# Patient Record
Sex: Female | Born: 1965 | Race: White | Hispanic: No | Marital: Married | Smoking: Former smoker
Health system: Southern US, Community
[De-identification: ages and names within clinical notes are randomized; demographics above are authoritative.]

## PROBLEM LIST (undated history)

## (undated) DIAGNOSIS — F99 Mental disorder, not otherwise specified: Secondary | ICD-10-CM

## (undated) DIAGNOSIS — R232 Flushing: Principal | ICD-10-CM

## (undated) DIAGNOSIS — Z87828 Personal history of other (healed) physical injury and trauma: Secondary | ICD-10-CM

## (undated) DIAGNOSIS — I10 Essential (primary) hypertension: Secondary | ICD-10-CM

## (undated) DIAGNOSIS — F419 Anxiety disorder, unspecified: Secondary | ICD-10-CM

## (undated) DIAGNOSIS — N950 Postmenopausal bleeding: Principal | ICD-10-CM

## (undated) DIAGNOSIS — G5600 Carpal tunnel syndrome, unspecified upper limb: Secondary | ICD-10-CM

## (undated) HISTORY — PX: CHOLECYSTECTOMY: SHX55

## (undated) HISTORY — DX: Personal history of other (healed) physical injury and trauma: Z87.828

## (undated) HISTORY — DX: Essential (primary) hypertension: I10

## (undated) HISTORY — DX: Carpal tunnel syndrome, unspecified upper limb: G56.00

## (undated) HISTORY — DX: Flushing: R23.2

## (undated) HISTORY — DX: Mental disorder, not otherwise specified: F99

## (undated) HISTORY — DX: Anxiety disorder, unspecified: F41.9

## (undated) HISTORY — DX: Postmenopausal bleeding: N95.0

---

## 2007-04-25 ENCOUNTER — Ambulatory Visit (HOSPITAL_COMMUNITY): Admission: RE | Admit: 2007-04-25 | Discharge: 2007-04-25 | Payer: Self-pay | Admitting: Family Medicine

## 2007-04-27 ENCOUNTER — Ambulatory Visit (HOSPITAL_COMMUNITY): Admission: RE | Admit: 2007-04-27 | Discharge: 2007-04-27 | Payer: Self-pay | Admitting: Family Medicine

## 2007-04-27 ENCOUNTER — Ambulatory Visit (HOSPITAL_COMMUNITY): Admission: RE | Admit: 2007-04-27 | Discharge: 2007-04-27 | Payer: Self-pay | Admitting: Obstetrics & Gynecology

## 2008-01-28 ENCOUNTER — Ambulatory Visit (HOSPITAL_COMMUNITY): Admission: RE | Admit: 2008-01-28 | Discharge: 2008-01-28 | Payer: Self-pay | Admitting: Obstetrics & Gynecology

## 2008-09-01 ENCOUNTER — Ambulatory Visit (HOSPITAL_COMMUNITY): Admission: RE | Admit: 2008-09-01 | Discharge: 2008-09-01 | Payer: Self-pay | Admitting: Family Medicine

## 2008-09-05 ENCOUNTER — Ambulatory Visit (HOSPITAL_COMMUNITY): Admission: RE | Admit: 2008-09-05 | Discharge: 2008-09-05 | Payer: Self-pay | Admitting: Family Medicine

## 2008-09-08 ENCOUNTER — Ambulatory Visit (HOSPITAL_COMMUNITY): Admission: RE | Admit: 2008-09-08 | Discharge: 2008-09-08 | Payer: Self-pay | Admitting: Family Medicine

## 2008-10-10 ENCOUNTER — Ambulatory Visit (HOSPITAL_COMMUNITY): Admission: RE | Admit: 2008-10-10 | Discharge: 2008-10-10 | Payer: Self-pay | Admitting: Neurology

## 2008-12-04 ENCOUNTER — Other Ambulatory Visit: Admission: RE | Admit: 2008-12-04 | Discharge: 2008-12-04 | Payer: Self-pay | Admitting: Obstetrics and Gynecology

## 2009-03-05 ENCOUNTER — Emergency Department (HOSPITAL_COMMUNITY): Admission: EM | Admit: 2009-03-05 | Discharge: 2009-03-06 | Payer: Self-pay | Admitting: Emergency Medicine

## 2010-01-29 ENCOUNTER — Ambulatory Visit (HOSPITAL_COMMUNITY): Admission: RE | Admit: 2010-01-29 | Discharge: 2010-01-29 | Payer: Self-pay | Admitting: Obstetrics & Gynecology

## 2010-03-25 ENCOUNTER — Other Ambulatory Visit: Admission: RE | Admit: 2010-03-25 | Discharge: 2010-03-25 | Payer: Self-pay | Admitting: Obstetrics and Gynecology

## 2010-10-10 ENCOUNTER — Encounter: Payer: Self-pay | Admitting: Family Medicine

## 2011-01-26 ENCOUNTER — Other Ambulatory Visit: Payer: Self-pay | Admitting: Family Medicine

## 2011-01-26 DIAGNOSIS — N644 Mastodynia: Secondary | ICD-10-CM

## 2011-02-01 NOTE — Procedures (Signed)
NAME:  Robin Dixon, Robin Dixon NO.:  [id number]   MEDICAL RECORD NO.:  [id number]          PATIENT TYPE:  OUT   LOCATION:  RAD                           FACILITY:  [facility]   PHYSICIAN:  Donna Bernard, M.D.DATE OF BIRTH:  17-May-1966   DATE OF PROCEDURE:  09/01/2008  DATE OF DISCHARGE:  09/01/2008                                  STRESS TEST   INDICATION FOR TEST:  This patient is experiencing atypical chest  discomfort and stress test was done for risk stratification purposes.   STRESS TEST:  Stress test was performed in standard [facility] protocol.  Resting EKG revealed normal sinus rhythm with no significant ST-T  changes.  The patient's baseline blood pressure was within normal  limits.  She tolerated the first three stages remarkably well.  She did  not reach her sub max predicted heart rate until the fourth stage.  At  this point, had 0.08 seconds passage at J-point.  There were no  significant ST-segment changes noted.  There is minimal less than 1 mm  depression with nicely ascending slope.   IMPRESSION:  Negative adequate stress test.   PLAN:  Exercise, encouraged the patient follow up with Dr. Lilyan Punt  for further concerns.      Donna Bernard, M.D.  Electronically Signed     WSL/MEDQ  D:  09/14/2008  T:  09/15/2008  Job:  [id number]

## 2011-02-02 ENCOUNTER — Ambulatory Visit (HOSPITAL_COMMUNITY)
Admission: RE | Admit: 2011-02-02 | Discharge: 2011-02-02 | Disposition: A | Discharge status: Home / Self Care | Payer: BC Managed Care – PPO | Source: Ambulatory Visit | Attending: Family Medicine | Admitting: Family Medicine

## 2011-02-02 ENCOUNTER — Other Ambulatory Visit: Payer: Self-pay | Admitting: Family Medicine

## 2011-02-02 DIAGNOSIS — Z1231 Encounter for screening mammogram for malignant neoplasm of breast: Secondary | ICD-10-CM | POA: Insufficient documentation

## 2011-02-02 DIAGNOSIS — N644 Mastodynia: Secondary | ICD-10-CM

## 2011-04-25 ENCOUNTER — Other Ambulatory Visit (HOSPITAL_COMMUNITY)
Admission: RE | Admit: 2011-04-25 | Discharge: 2011-04-25 | Disposition: A | Discharge status: Home / Self Care | Payer: BC Managed Care – PPO | Source: Ambulatory Visit | Attending: Obstetrics and Gynecology | Admitting: Obstetrics and Gynecology

## 2011-04-25 ENCOUNTER — Other Ambulatory Visit: Payer: Self-pay | Admitting: Adult Health

## 2011-04-25 DIAGNOSIS — Z01419 Encounter for gynecological examination (general) (routine) without abnormal findings: Secondary | ICD-10-CM | POA: Insufficient documentation

## 2012-02-21 ENCOUNTER — Other Ambulatory Visit: Payer: Self-pay | Admitting: Family Medicine

## 2012-02-21 DIAGNOSIS — Z139 Encounter for screening, unspecified: Secondary | ICD-10-CM

## 2012-02-24 ENCOUNTER — Ambulatory Visit (HOSPITAL_COMMUNITY)
Admission: RE | Admit: 2012-02-24 | Discharge: 2012-02-24 | Disposition: A | Discharge status: Home / Self Care | Payer: BC Managed Care – PPO | Source: Ambulatory Visit | Attending: Family Medicine | Admitting: Family Medicine

## 2012-02-24 DIAGNOSIS — Z139 Encounter for screening, unspecified: Secondary | ICD-10-CM

## 2012-02-24 DIAGNOSIS — Z1231 Encounter for screening mammogram for malignant neoplasm of breast: Secondary | ICD-10-CM | POA: Insufficient documentation

## 2012-04-26 ENCOUNTER — Other Ambulatory Visit (HOSPITAL_COMMUNITY)
Admission: RE | Admit: 2012-04-26 | Discharge: 2012-04-26 | Disposition: A | Discharge status: Home / Self Care | Payer: BC Managed Care – PPO | Source: Ambulatory Visit | Attending: Obstetrics and Gynecology | Admitting: Obstetrics and Gynecology

## 2012-04-26 ENCOUNTER — Other Ambulatory Visit: Payer: Self-pay | Admitting: Adult Health

## 2012-04-26 DIAGNOSIS — Z01419 Encounter for gynecological examination (general) (routine) without abnormal findings: Secondary | ICD-10-CM | POA: Insufficient documentation

## 2012-04-26 DIAGNOSIS — Z1151 Encounter for screening for human papillomavirus (HPV): Secondary | ICD-10-CM | POA: Insufficient documentation

## 2012-12-12 ENCOUNTER — Other Ambulatory Visit: Payer: Self-pay | Admitting: Family Medicine

## 2012-12-12 ENCOUNTER — Telehealth: Payer: Self-pay | Admitting: Family Medicine

## 2012-12-12 MED ORDER — ALPRAZOLAM 0.5 MG PO TABS: 0.5000 mg | ORAL_TABLET | Freq: Two times a day (BID) | ORAL | Status: DC

## 2012-12-12 NOTE — Telephone Encounter (Signed)
Nurse: 9:25AM Steve/Carolyn Patient: Robin Dixon dob 02-Apr-1966  Msg: refill has ran out for Zanax * last refill has been a while. Requesting for a new rx. CB: [phone] Pharm: [facility] - [place] ph. [phone]

## 2012-12-12 NOTE — Telephone Encounter (Signed)
Called xanax .5 # 30 w/2 refills per scott

## 2012-12-12 NOTE — Telephone Encounter (Signed)
May refill times 2 

## 2013-01-08 ENCOUNTER — Other Ambulatory Visit: Payer: Self-pay | Admitting: Family Medicine

## 2013-01-22 ENCOUNTER — Other Ambulatory Visit: Payer: Self-pay | Admitting: Adult Health

## 2013-01-22 DIAGNOSIS — Z139 Encounter for screening, unspecified: Secondary | ICD-10-CM

## 2013-01-30 ENCOUNTER — Other Ambulatory Visit: Payer: Self-pay | Admitting: Nurse Practitioner

## 2013-01-30 ENCOUNTER — Other Ambulatory Visit: Payer: Self-pay | Admitting: Family Medicine

## 2013-02-22 ENCOUNTER — Ambulatory Visit (INDEPENDENT_AMBULATORY_CARE_PROVIDER_SITE_OTHER): Payer: BC Managed Care – PPO | Admitting: Family Medicine

## 2013-02-22 ENCOUNTER — Encounter: Payer: Self-pay | Admitting: Family Medicine

## 2013-02-22 ENCOUNTER — Ambulatory Visit: Payer: Self-pay | Admitting: Family Medicine

## 2013-02-22 VITALS — BP 122/76 | Temp 98.2°F | Wt 171.0 lb

## 2013-02-22 DIAGNOSIS — J322 Chronic ethmoidal sinusitis: Secondary | ICD-10-CM

## 2013-02-22 MED ORDER — AMOXICILLIN-POT CLAVULANATE 875-125 MG PO TABS: 1.0000 | ORAL_TABLET | Freq: Two times a day (BID) | ORAL | Status: AC

## 2013-02-22 NOTE — Progress Notes (Signed)
  Subjective:    Patient ID: Robin Dixon, female    DOB: [birth year], 47 y.o.   MRN: [id number]  Sore Throat  This is a new problem. The current episode started 1 to 4 weeks ago. The problem has been gradually worsening. There has been no fever. The pain is at a severity of 5/10. The pain is moderate. Associated symptoms include coughing (non prod) and neck pain. The treatment provided mild relief.     No trouble with vomiting or diarrhea. Review of Systems  HENT: Positive for neck pain.   Respiratory: Positive for cough (non prod).    ROS otherwise negative.     Objective:   Physical Exam   alert no acute heart regular in rhythm.distress. Frontal maxillarytenderness. some congestion intermittently heart regular rate and rhythm lungs clear   sinusitis discussed at length. Plan appropriate antibiotics. Symptomatic care discussed.      Assessment & Plan:

## 2013-02-22 NOTE — Patient Instructions (Signed)
Take all the antibiotics 

## 2013-02-25 ENCOUNTER — Ambulatory Visit (HOSPITAL_COMMUNITY)
Admission: RE | Admit: 2013-02-25 | Discharge: 2013-02-25 | Disposition: A | Discharge status: Home / Self Care | Payer: BC Managed Care – PPO | Source: Ambulatory Visit | Attending: Adult Health | Admitting: Adult Health

## 2013-02-25 DIAGNOSIS — Z139 Encounter for screening, unspecified: Secondary | ICD-10-CM

## 2013-02-25 DIAGNOSIS — Z1231 Encounter for screening mammogram for malignant neoplasm of breast: Secondary | ICD-10-CM | POA: Insufficient documentation

## 2013-03-08 ENCOUNTER — Other Ambulatory Visit: Payer: Self-pay | Admitting: Family Medicine

## 2013-03-18 ENCOUNTER — Other Ambulatory Visit: Payer: Self-pay

## 2013-03-18 MED ORDER — METOPROLOL TARTRATE 50 MG PO TABS: ORAL_TABLET | ORAL | Status: DC

## 2013-03-19 ENCOUNTER — Encounter: Payer: Self-pay | Admitting: *Deleted

## 2013-03-21 ENCOUNTER — Other Ambulatory Visit: Payer: Self-pay | Admitting: *Deleted

## 2013-03-21 MED ORDER — METOPROLOL TARTRATE 50 MG PO TABS: ORAL_TABLET | ORAL | Status: DC

## 2013-03-26 ENCOUNTER — Telehealth: Payer: Self-pay | Admitting: Adult Health

## 2013-03-26 NOTE — Telephone Encounter (Signed)
Left message

## 2013-03-26 NOTE — Telephone Encounter (Signed)
Robin Dixon is complaining of hot flashes, night sweats and not sleeping, no period in over a year.Will make appt for 7/11 art 12:45pm to discuss options, she hs a pap and physical scheduled for august.

## 2013-03-29 ENCOUNTER — Encounter: Payer: Self-pay | Admitting: Adult Health

## 2013-03-29 ENCOUNTER — Ambulatory Visit (INDEPENDENT_AMBULATORY_CARE_PROVIDER_SITE_OTHER): Payer: BC Managed Care – PPO | Admitting: Adult Health

## 2013-03-29 VITALS — BP 136/68 | Ht 66.0 in | Wt 176.0 lb

## 2013-03-29 DIAGNOSIS — F411 Generalized anxiety disorder: Secondary | ICD-10-CM

## 2013-03-29 DIAGNOSIS — N951 Menopausal and female climacteric states: Secondary | ICD-10-CM

## 2013-03-29 DIAGNOSIS — I10 Essential (primary) hypertension: Secondary | ICD-10-CM

## 2013-03-29 DIAGNOSIS — R232 Flushing: Secondary | ICD-10-CM

## 2013-03-29 DIAGNOSIS — F419 Anxiety disorder, unspecified: Secondary | ICD-10-CM

## 2013-03-29 HISTORY — DX: Anxiety disorder, unspecified: F41.9

## 2013-03-29 HISTORY — DX: Flushing: R23.2

## 2013-03-29 MED ORDER — ESTRADIOL-NORETHINDRONE ACET 0.05-0.14 MG/DAY TD PTTW: 1.0000 | MEDICATED_PATCH | TRANSDERMAL | Status: DC

## 2013-03-29 NOTE — Progress Notes (Signed)
Subjective:     Patient ID: Robin Dixon, female   DOB: 02/21/1966, 47 y.o.   MRN: [id number]  HPI Robin Dixon is a 47 year old white female in complaining of hot flashes, night sweats and not sleeping well.She has not had a period in over a year. No known mood changes, takes lexapro.  Review of Systems Positives in HPI Reviewed past medical,surgical, social and family history. Reviewed medications and allergies.     Objective:   Physical Exam BP 136/68  Ht 5\' 6"  (1.676 m)  Wt 176 lb (79.833 kg)  BMI 28.42 kg/m2   Talk only, discussed her symptoms and treatment options and decided to try Combi patch. Assessment:      Hot flashes,night sweats,and not sleeping History of anxiety and hypertension    Plan:      Combi patch 0.05-0.14mg  # 4 patches given, exp 9/14 1 patch 2 x weekly, she is aware of risk and benefits and wants to try them. Return August 15 as scheduled for pap Review handout on menopause and call back in 2 weeks to see how patches working, will RX if working well. Call prn

## 2013-03-29 NOTE — Patient Instructions (Addendum)
Try combi patchMenopause Menopause is the normal time of life when menstrual periods stop completely. Menopause is complete when you have missed 12 consecutive menstrual periods. It usually occurs between the ages of 49 to 31, with an average age of 49. Very rarely does a woman develop menopause before 49 years old. At menopause, your ovaries stop producing the female hormones, estrogen and progesterone. This can cause undesirable symptoms and also affect your health. Sometimes the symptoms may occur 4 to 5 years before the menopause begins. There is no relationship between menopause and:  Oral contraceptives.  Number of children you had.  Race.  The age your menstrual periods started (menarche). Heavy smokers and very thin women may develop menopause earlier in life. CAUSES  The ovaries stop producing the female hormones estrogen and progesterone.  Other causes include:  Surgery to remove both ovaries.  The ovaries stop functioning for no known reason.  Tumors of the pituitary gland in the brain.  Medical disease that affects the ovaries and hormone production.  Radiation treatment to the abdomen or pelvis.  Chemotherapy that affects the ovaries. SYMPTOMS   Hot flashes.  Night sweats.  Decrease in sex drive.  Vaginal dryness and thinning of the vagina causing painful intercourse.  Dryness of the skin and developing wrinkles.  Headaches.  Tiredness.  Irritability.  Memory problems.  Weight gain.  Bladder infections.  Hair growth of the face and chest.  Infertility. More serious symptoms include:  Loss of bone (osteoporosis) causing breaks (fractures).  Depression.  Hardening and narrowing of the arteries (atherosclerosis) causing heart attacks and strokes. DIAGNOSIS   When the menstrual periods have stopped for 12 straight months.  Physical exam.  Hormone studies of the blood. TREATMENT  There are many treatment choices and nearly as many  questions about them. The decisions to treat or not to treat menopausal changes is an individual choice made with your caregiver. Your caregiver can discuss the treatments with you. Together, you can decide which treatment will work best for you. Your treatment choices may include:   Hormone therapy (estorgen and progesterone).  Non-hormonal medications.  Treating the individual symptoms with medication (for example antidepressants for depression).  Herbal medications that may help specific symptoms.  Counseling by a psychiatrist or psychologist.  Group therapy.  Lifestyle changes including:  Eating healthy.  Regular exercise.  Limiting caffeine and alcohol.  Stress management and meditation.  No treatment. HOME CARE INSTRUCTIONS   Take the medication your caregiver gives you as directed.  Get plenty of sleep and rest.  Exercise regularly.  Eat a diet that contains calcium (good for the bones) and soy products (acts like estrogen hormone).  Avoid alcoholic beverages.  Do not smoke.  If you have hot flashes, dress in layers.  Take supplements, calcium and vitamin D to strengthen bones.  You can use over-the-counter lubricants or moisturizers for vaginal dryness.  Group therapy is sometimes very helpful.  Acupuncture may be helpful in some cases. SEEK MEDICAL CARE IF:   You are not sure you are in menopause.  You are having menopausal symptoms and need advice and treatment.  You are still having menstrual periods after age 1.  You have pain with intercourse.  Menopause is complete (no menstrual period for 12 months) and you develop vaginal bleeding.  You need a referral to a specialist (gynecologist, psychiatrist or psychologist) for treatment. SEEK IMMEDIATE MEDICAL CARE IF:   You have severe depression.  You have excessive vaginal bleeding.  You  fell and think you have a broken bone.  You have pain when you urinate.  You develop leg or chest  pain.  You have a fast pounding heart beat (palpitations).  You have severe headaches.  You develop vision problems.  You feel a lump in your breast.  You have abdominal pain or severe indigestion. Document Released: 11/26/2003 Document Revised: 11/28/2011 Document Reviewed: 07/03/2008 [facility] Patient Information 2014 [place], [place]. Follow up as scheduled

## 2013-04-08 ENCOUNTER — Other Ambulatory Visit: Payer: Self-pay | Admitting: Adult Health

## 2013-04-08 ENCOUNTER — Telehealth: Payer: Self-pay | Admitting: Adult Health

## 2013-04-08 MED ORDER — ESTRADIOL-NORETHINDRONE ACET 0.05-0.14 MG/DAY TD PTTW: 1.0000 | MEDICATED_PATCH | TRANSDERMAL | Status: DC

## 2013-04-18 NOTE — Telephone Encounter (Signed)
Already taken care of by [facility].

## 2013-05-03 ENCOUNTER — Encounter: Payer: Self-pay | Admitting: Adult Health

## 2013-05-03 ENCOUNTER — Ambulatory Visit (INDEPENDENT_AMBULATORY_CARE_PROVIDER_SITE_OTHER): Payer: BC Managed Care – PPO | Admitting: Adult Health

## 2013-05-03 ENCOUNTER — Other Ambulatory Visit (HOSPITAL_COMMUNITY)
Admission: RE | Admit: 2013-05-03 | Discharge: 2013-05-03 | Disposition: A | Discharge status: Home / Self Care | Payer: BC Managed Care – PPO | Source: Ambulatory Visit | Attending: Adult Health | Admitting: Adult Health

## 2013-05-03 ENCOUNTER — Other Ambulatory Visit: Payer: Self-pay | Admitting: *Deleted

## 2013-05-03 VITALS — BP 102/68 | HR 72 | Ht 68.0 in | Wt 178.0 lb

## 2013-05-03 DIAGNOSIS — Z01419 Encounter for gynecological examination (general) (routine) without abnormal findings: Secondary | ICD-10-CM

## 2013-05-03 DIAGNOSIS — Z79890 Hormone replacement therapy (postmenopausal): Secondary | ICD-10-CM

## 2013-05-03 DIAGNOSIS — Z1151 Encounter for screening for human papillomavirus (HPV): Secondary | ICD-10-CM | POA: Insufficient documentation

## 2013-05-03 DIAGNOSIS — Z1212 Encounter for screening for malignant neoplasm of rectum: Secondary | ICD-10-CM

## 2013-05-03 LAB — HEMOCCULT GUIAC POC 1CARD (OFFICE)

## 2013-05-03 MED ORDER — ESCITALOPRAM OXALATE 20 MG PO TABS: 20.0000 mg | ORAL_TABLET | Freq: Every day | ORAL | Status: DC

## 2013-05-03 NOTE — Patient Instructions (Addendum)
Physical in 1 year Mammogram yearly Colonoscopy at 48 Labs at PCP  

## 2013-05-03 NOTE — Assessment & Plan Note (Signed)
Feels better with combi patch

## 2013-05-03 NOTE — Progress Notes (Signed)
Patient ID: Robin Dixon, female   DOB: 08/09/1966, 47 y.o.   MRN: [id number] History of Present Illness: Robin Dixon is a 47 year old white female in for pap and physical.   Current Medications, Allergies, Past Medical History, Past Surgical History, Family History and Social History were reviewed in [facility] electronic medical record.     Review of Systems: Patient denies any headaches, blurred vision, shortness of breath, chest pain, abdominal pain, problems with bowel movements, urination, or intercourse. No joint pain or mood changes.She says she is much better since starting the combi patch.    Physical Exam:BP 102/68  Pulse 72  Ht 5\' 8"  (1.727 m)  Wt 178 lb (80.74 kg)  BMI 27.07 kg/m2 General:  Well developed, well nourished, no acute distress Skin:  Warm and dry Neck:  Midline trachea, normal thyroid Lungs; Clear to auscultation bilaterally Breast:  No dominant palpable mass, retraction, or nipple discharge Cardiovascular: Regular rate and rhythm Abdomen:  Soft, non tender, no hepatosplenomegaly Pelvic:  External genitalia is normal in appearance.  The vagina is normal in appearance.  The cervix is nulliparous. Pap with HPV. Uterus is felt to be normal size, shape, and contour.  No  adnexal masses or tenderness noted. Rectal: Good sphincter tone, no polyps, or hemorrhoids felt.  Hemoccult negative. Extremities:  No swelling or varicosities noted Psych: Alert and cooperative, seems happy   Impression: Yearly exam HRT    Plan: Physical in 1 year  Mammogram yearly Colonoscopy at 48  Labs with PCP Continue patches, call any problems

## 2013-06-03 ENCOUNTER — Other Ambulatory Visit: Payer: Self-pay | Admitting: *Deleted

## 2013-06-03 MED ORDER — ALPRAZOLAM 0.5 MG PO TABS: 0.5000 mg | ORAL_TABLET | Freq: Two times a day (BID) | ORAL | Status: DC | PRN

## 2013-06-05 ENCOUNTER — Other Ambulatory Visit: Payer: Self-pay | Admitting: *Deleted

## 2013-06-05 MED ORDER — NAPROXEN 500 MG PO TABS: 500.0000 mg | ORAL_TABLET | Freq: Two times a day (BID) | ORAL | Status: DC

## 2013-06-10 ENCOUNTER — Telehealth: Payer: Self-pay | Admitting: *Deleted

## 2013-06-10 NOTE — Telephone Encounter (Signed)
Having bleeding on combi patch will get [place] 9/24 at 3 pm

## 2013-06-11 ENCOUNTER — Other Ambulatory Visit: Payer: Self-pay | Admitting: Adult Health

## 2013-06-11 DIAGNOSIS — N950 Postmenopausal bleeding: Secondary | ICD-10-CM

## 2013-06-12 ENCOUNTER — Ambulatory Visit (INDEPENDENT_AMBULATORY_CARE_PROVIDER_SITE_OTHER): Payer: BC Managed Care – PPO | Admitting: Adult Health

## 2013-06-12 ENCOUNTER — Encounter: Payer: Self-pay | Admitting: Adult Health

## 2013-06-12 ENCOUNTER — Ambulatory Visit (INDEPENDENT_AMBULATORY_CARE_PROVIDER_SITE_OTHER): Payer: BC Managed Care – PPO

## 2013-06-12 VITALS — BP 112/74 | Ht 67.0 in | Wt 182.0 lb

## 2013-06-12 DIAGNOSIS — N950 Postmenopausal bleeding: Secondary | ICD-10-CM

## 2013-06-12 DIAGNOSIS — Z79890 Hormone replacement therapy (postmenopausal): Secondary | ICD-10-CM

## 2013-06-12 HISTORY — DX: Postmenopausal bleeding: N95.0

## 2013-06-12 NOTE — Progress Notes (Signed)
Subjective:     Patient ID: Robin Dixon, female   DOB: [birth year], 47 y.o.   MRN: [id number]  HPI Robin Dixon is in for [place] had some postmenopausal bleeding after starting Combi patch.  Review of Systems See HPI Reviewed past medical,surgical, social and family history. Reviewed medications and allergies.     Objective:   Physical Exam BP 112/74  Ht 5\' 7"  (1.702 m)  Wt 182 lb (82.555 kg)  BMI 28.5 kg/m2   Reviewed [place] with pt, had thin endometrium, will continue patches  Assessment:     PMB on HRT    Plan:     Continue the patch and follow up prn

## 2013-06-12 NOTE — Patient Instructions (Addendum)
Follow up prn  continue HRT

## 2013-06-20 ENCOUNTER — Ambulatory Visit (INDEPENDENT_AMBULATORY_CARE_PROVIDER_SITE_OTHER): Payer: BC Managed Care – PPO | Admitting: Nurse Practitioner

## 2013-06-20 ENCOUNTER — Encounter: Payer: Self-pay | Admitting: Nurse Practitioner

## 2013-06-20 VITALS — BP 122/80 | Ht 67.0 in | Wt 181.0 lb

## 2013-06-20 DIAGNOSIS — M7710 Lateral epicondylitis, unspecified elbow: Secondary | ICD-10-CM

## 2013-06-20 DIAGNOSIS — F419 Anxiety disorder, unspecified: Secondary | ICD-10-CM

## 2013-06-20 DIAGNOSIS — M7712 Lateral epicondylitis, left elbow: Secondary | ICD-10-CM

## 2013-06-20 DIAGNOSIS — I10 Essential (primary) hypertension: Secondary | ICD-10-CM

## 2013-06-20 DIAGNOSIS — F411 Generalized anxiety disorder: Secondary | ICD-10-CM

## 2013-06-20 MED ORDER — ALPRAZOLAM 0.5 MG PO TABS: 0.5000 mg | ORAL_TABLET | Freq: Two times a day (BID) | ORAL | Status: DC | PRN

## 2013-06-20 NOTE — Patient Instructions (Addendum)
Loratadine 10 mg each morning Nasacort AQ as directed    Lateral Epicondylitis (Tennis Elbow) with Rehab Lateral epicondylitis involves inflammation and pain around the outer portion of the elbow. The pain is caused by inflammation of the tendons in the forearm that bring back (extend) the wrist. Lateral epicondylittis is also called tennis elbow, because it is very common in tennis players. However, it may occur in any individual who extends the wrist repetitively. If lateral epicondylitis is left untreated, it may become a chronic problem. SYMPTOMS   Pain, tenderness, and inflammation on the outer (lateral) side of the elbow.  Pain or weakness with gripping activities.  Pain that increases with wrist twisting motions (playing tennis, using a screwdriver, opening a door or a jar).  Pain with lifting objects, including a coffee cup. CAUSES  Lateral epicondylitis is caused by inflammation of the tendons that extend the wrist. Causes of injury may include:  Repetitive stress and strain on the muscles and tendons that extend the wrist.  Sudden change in activity level or intensity.  Incorrect grip in racquet sports.  Incorrect grip size of racquet (often too large).  Incorrect hitting position or technique (usually backhand, leading with the elbow).  Using a racket that is too heavy. RISK INCREASES WITH:  Sports or occupations that require repetitive and/or strenuous forearm and wrist movements (tennis, squash, racquetball, carpentry).  Poor wrist and forearm strength and flexibility.  Failure to warm up properly before activity.  Resuming activity before healing, rehabilitation, and conditioning are complete. PREVENTION   Warm up and stretch properly before activity.  Maintain physical fitness:  Strength, flexibility, and endurance.  Cardiovascular fitness.  Wear and use properly fitted equipment.  Learn and use proper technique and have a coach correct improper  technique.  Wear a tennis elbow (counterforce) brace. PROGNOSIS  The course of this condition depends on the degree of the injury. If treated properly, acute cases (symptoms lasting less than 4 weeks) are often resolved in 2 to 6 weeks. Chronic (longer lasting cases) often resolve in 3 to 6 months, but may require physical therapy. RELATED COMPLICATIONS   Frequently recurring symptoms, resulting in a chronic problem. Properly treating the problem the first time decreases frequency of recurrence.  Chronic inflammation, scarring tendon degeneration, and partial tendon tear, requiring surgery.  Delayed healing or resolution of symptoms. TREATMENT  Treatment first involves the use of ice and medicine, to reduce pain and inflammation. Strengthening and stretching exercises may help reduce discomfort, if performed regularly. These exercises may be performed at home, if the condition is an acute injury. Chronic cases may require a referral to a physical therapist for evaluation and treatment. Your caregiver may advise a corticosteroid injection, to help reduce inflammation. Rarely, surgery is needed. MEDICATION  If pain medicine is needed, nonsteroidal anti-inflammatory medicines (aspirin and ibuprofen), or other minor pain relievers (acetaminophen), are often advised.  Do not take pain medicine for 7 days before surgery.  Prescription pain relievers may be given, if your caregiver thinks they are needed. Use only as directed and only as much as you need.  Corticosteroid injections may be recommended. These injections should be reserved only for the most severe cases, because they can only be given a certain number of times. HEAT AND COLD  Cold treatment (icing) should be applied for 10 to 15 minutes every 2 to 3 hours for inflammation and pain, and immediately after activity that aggravates your symptoms. Use ice packs or an ice massage.  Heat treatment  may be used before performing stretching  and strengthening activities prescribed by your caregiver, physical therapist, or athletic trainer. Use a heat pack or a warm water soak. SEEK MEDICAL CARE IF: Symptoms get worse or do not improve in 2 weeks, despite treatment. EXERCISES  RANGE OF MOTION (ROM) AND STRETCHING EXERCISES - Epicondylitis, Lateral (Tennis Elbow) These exercises may help you when beginning to rehabilitate your injury. Your symptoms may go away with or without further involvement from your physician, physical therapist or athletic trainer. While completing these exercises, remember:   Restoring tissue flexibility helps normal motion to return to the joints. This allows healthier, less painful movement and activity.  An effective stretch should be held for at least 30 seconds.  A stretch should never be painful. You should only feel a gentle lengthening or release in the stretched tissue. RANGE OF MOTION  Wrist Flexion, Active-Assisted  Extend your right / left elbow with your fingers pointing down.*  Gently pull the back of your hand towards you, until you feel a gentle stretch on the top of your forearm.  Hold this position for __________ seconds. Repeat __________ times. Complete this exercise __________ times per day.  *If directed by your physician, physical therapist or athletic trainer, complete this stretch with your elbow bent, rather than extended. RANGE OF MOTION  Wrist Extension, Active-Assisted  Extend your right / left elbow and turn your palm upwards.*  Gently pull your palm and fingertips back, so your wrist extends and your fingers point more toward the ground.  You should feel a gentle stretch on the inside of your forearm.  Hold this position for __________ seconds. Repeat __________ times. Complete this exercise __________ times per day. *If directed by your physician, physical therapist or athletic trainer, complete this stretch with your elbow bent, rather than extended. STRETCH - Wrist  Flexion  Place the back of your right / left hand on a tabletop, leaving your elbow slightly bent. Your fingers should point away from your body.  Gently press the back of your hand down onto the table by straightening your elbow. You should feel a stretch on the top of your forearm.  Hold this position for __________ seconds. Repeat __________ times. Complete this stretch __________ times per day.  STRETCH  Wrist Extension   Place your right / left fingertips on a tabletop, leaving your elbow slightly bent. Your fingers should point backwards.  Gently press your fingers and palm down onto the table by straightening your elbow. You should feel a stretch on the inside of your forearm.  Hold this position for __________ seconds. Repeat __________ times. Complete this stretch __________ times per day.  STRENGTHENING EXERCISES - Epicondylitis, Lateral (Tennis Elbow) These exercises may help you when beginning to rehabilitate your injury. They may resolve your symptoms with or without further involvement from your physician, physical therapist or athletic trainer. While completing these exercises, remember:   Muscles can gain both the endurance and the strength needed for everyday activities through controlled exercises.  Complete these exercises as instructed by your physician, physical therapist or athletic trainer. Increase the resistance and repetitions only as guided.  You may experience muscle soreness or fatigue, but the pain or discomfort you are trying to eliminate should never worsen during these exercises. If this pain does get worse, stop and make sure you are following the directions exactly. If the pain is still present after adjustments, discontinue the exercise until you can discuss the trouble with your caregiver. STRENGTH  Wrist Flexors  Sit with your right / left forearm palm-up and fully supported on a table or countertop. Your elbow should be resting below the height of your  shoulder. Allow your wrist to extend over the edge of the surface.  Loosely holding a __________ weight, or a piece of rubber exercise band or tubing, slowly curl your hand up toward your forearm.  Hold this position for __________ seconds. Slowly lower the wrist back to the starting position in a controlled manner. Repeat __________ times. Complete this exercise __________ times per day.  STRENGTH  Wrist Extensors  Sit with your right / left forearm palm-down and fully supported on a table or countertop. Your elbow should be resting below the height of your shoulder. Allow your wrist to extend over the edge of the surface.  Loosely holding a __________ weight, or a piece of rubber exercise band or tubing, slowly curl your hand up toward your forearm.  Hold this position for __________ seconds. Slowly lower the wrist back to the starting position in a controlled manner. Repeat __________ times. Complete this exercise __________ times per day.  STRENGTH - Ulnar Deviators  Stand with a ____________________ weight in your right / left hand, or sit while holding a rubber exercise band or tubing, with your healthy arm supported on a table or countertop.  Move your wrist, so that your pinkie travels toward your forearm and your thumb moves away from your forearm.  Hold this position for __________ seconds and then slowly lower the wrist back to the starting position. Repeat __________ times. Complete this exercise __________ times per day STRENGTH - Radial Deviators  Stand with a ____________________ weight in your right / left hand, or sit while holding a rubber exercise band or tubing, with your injured arm supported on a table or countertop.  Raise your hand upward in front of you or pull up on the rubber tubing.  Hold this position for __________ seconds and then slowly lower the wrist back to the starting position. Repeat __________ times. Complete this exercise __________ times per  day. STRENGTH  Forearm Supinators   Sit with your right / left forearm supported on a table, keeping your elbow below shoulder height. Rest your hand over the edge, palm down.  Gently grip a hammer or a soup ladle.  Without moving your elbow, slowly turn your palm and hand upward to a "thumbs-up" position.  Hold this position for __________ seconds. Slowly return to the starting position. Repeat __________ times. Complete this exercise __________ times per day.  STRENGTH  Forearm Pronators   Sit with your right / left forearm supported on a table, keeping your elbow below shoulder height. Rest your hand over the edge, palm up.  Gently grip a hammer or a soup ladle.  Without moving your elbow, slowly turn your palm and hand upward to a "thumbs-up" position.  Hold this position for __________ seconds. Slowly return to the starting position. Repeat __________ times. Complete this exercise __________ times per day.  STRENGTH - Grip  Grasp a tennis ball, a dense sponge, or a large, rolled sock in your hand.  Squeeze as hard as you can, without increasing any pain.  Hold this position for __________ seconds. Release your grip slowly. Repeat __________ times. Complete this exercise __________ times per day.  STRENGTH - Elbow Extensors, Isometric  Stand or sit upright, on a firm surface. Place your right / left arm so that your palm faces your stomach, and it is  at the height of your waist.  Place your opposite hand on the underside of your forearm. Gently push up as your right / left arm resists. Push as hard as you can with both arms, without causing any pain or movement at your right / left elbow. Hold this stationary position for __________ seconds. Gradually release the tension in both arms. Allow your muscles to relax completely before repeating. Document Released: 09/05/2005 Document Revised: 11/28/2011 Document Reviewed: 12/18/2008 [facility] Patient Information 2014 [place],  [place].

## 2013-06-21 ENCOUNTER — Encounter: Payer: Self-pay | Admitting: Nurse Practitioner

## 2013-06-21 NOTE — Assessment & Plan Note (Signed)
BP stable. Continue current meds as directed. Recheck 3 months.

## 2013-06-21 NOTE — Assessment & Plan Note (Signed)
Discussed importance of stress reduction. Increase Xanax to one tab on the days for her anxiety is worse. Continue Lexapro as directed

## 2013-06-21 NOTE — Progress Notes (Signed)
Subjective:  Presents for recheck on her hypertension. No chest pain shortness of breath. No edema. Gets regular preventive health physicals with her gynecologist. Anxiety symptoms overall have been stable on Lexapro, does have some breakthrough symptoms at times. Has been taking half of her Xanax on occasion, does not need it every day. Also complaints of left lateral elbow tenderness for the past few weeks. No specific history of injury. Localized to this area. Off-and-on pain. No weakness.  Objective:   BP 122/80  Ht 5\' 7"  (1.702 m)  Wt 181 lb (82.101 kg)  BMI 28.34 kg/m2 NAD. Alert, oriented. Calm affect. Lungs clear. Heart regular rate rhythm. Carotids no bruits or thrills. Lower extremities no edema. Mild tenderness to the left elbow near the lateral epicondyle. Hand and arm strength 5+. Sensation grossly intact. Strong radial pulses.  Assessment:Hypertension  Anxiety  Lateral epicondylitis, left  Plan: Meds ordered this encounter  Medications  . ALPRAZolam (XANAX) 0.5 MG tablet    Sig: Take 1 tablet (0.5 mg total) by mouth 2 (two) times daily as needed.    Dispense:  60 tablet    Refill:  2    Order Specific Question:  Supervising Provider    Answer:  Merlyn Albert [[id number]]   Given information on lateral epicondylitis. OTC Aleve as directed when necessary with food. DC if any stomach upset. Also arm band as directed. Discussed importance of stress reduction. Increase Xanax to one tab on the days for her anxiety is worse. Continue Lexapro as directed. Defers flu vaccine, available at work. Recheck in 3 months, call back sooner if any problems. Lab work due at next visit.

## 2013-07-10 ENCOUNTER — Other Ambulatory Visit: Payer: Self-pay

## 2013-07-10 MED ORDER — PANTOPRAZOLE SODIUM 40 MG PO TBEC: DELAYED_RELEASE_TABLET | ORAL | Status: DC

## 2013-07-15 ENCOUNTER — Other Ambulatory Visit: Payer: Self-pay | Admitting: *Deleted

## 2013-07-15 MED ORDER — PANTOPRAZOLE SODIUM 40 MG PO TBEC: DELAYED_RELEASE_TABLET | ORAL | Status: DC

## 2013-07-16 ENCOUNTER — Telehealth: Payer: Self-pay | Admitting: Adult Health

## 2013-07-16 MED ORDER — ESTRADIOL 0.52 MG/0.87 GM (0.06%) TD GEL: 1.0000 "application " | Freq: Every day | TRANSDERMAL | Status: DC

## 2013-07-16 MED ORDER — PROGESTERONE MICRONIZED 200 MG PO CAPS: 200.0000 mg | ORAL_CAPSULE | Freq: Every day | ORAL | Status: DC

## 2013-07-16 NOTE — Telephone Encounter (Signed)
Pt states combi patch breaking her out at area were patch is only x 2 months. Would like something else.

## 2013-07-16 NOTE — Telephone Encounter (Signed)
Pt complains of rash with patch will change to prometrium and elestrin

## 2013-07-25 ENCOUNTER — Other Ambulatory Visit: Payer: Self-pay | Admitting: Nurse Practitioner

## 2013-07-25 ENCOUNTER — Telehealth: Payer: Self-pay | Admitting: Nurse Practitioner

## 2013-07-25 MED ORDER — AMOXICILLIN-POT CLAVULANATE 875-125 MG PO TABS: 1.0000 | ORAL_TABLET | Freq: Two times a day (BID) | ORAL | Status: DC

## 2013-07-25 NOTE — Telephone Encounter (Signed)
Left message on voicemail to return call.

## 2013-07-25 NOTE — Telephone Encounter (Signed)
Will send in Rx for antibiotic. Call back if no improvement.

## 2013-07-25 NOTE — Telephone Encounter (Signed)
Discussed with pt

## 2013-07-25 NOTE — Telephone Encounter (Signed)
Patient is still having head congestion, sore throat, drainage, cough at night. Patient says at last appointment she was told by Eber Jones that we could call something in for patient.   [facility]

## 2013-07-25 NOTE — Telephone Encounter (Signed)
No fever, chest congestion, green nasal draingage with blood, No wheezing, No SOB, sorethroat.

## 2013-07-25 NOTE — Telephone Encounter (Signed)
Need a little more information. Any fever? Color to congestion? Wheezing? SOB? Thanks.

## 2013-08-07 ENCOUNTER — Other Ambulatory Visit: Payer: Self-pay | Admitting: Family Medicine

## 2013-09-16 ENCOUNTER — Other Ambulatory Visit: Payer: Self-pay | Admitting: *Deleted

## 2013-09-16 MED ORDER — METOPROLOL TARTRATE 50 MG PO TABS: ORAL_TABLET | ORAL | Status: DC

## 2013-11-12 ENCOUNTER — Other Ambulatory Visit: Payer: Self-pay | Admitting: Family Medicine

## 2013-11-19 ENCOUNTER — Ambulatory Visit (INDEPENDENT_AMBULATORY_CARE_PROVIDER_SITE_OTHER): Payer: BC Managed Care – PPO | Admitting: Nurse Practitioner

## 2013-11-19 ENCOUNTER — Encounter: Payer: Self-pay | Admitting: Nurse Practitioner

## 2013-11-19 ENCOUNTER — Encounter: Payer: Self-pay | Admitting: Family Medicine

## 2013-11-19 ENCOUNTER — Telehealth: Payer: Self-pay | Admitting: Family Medicine

## 2013-11-19 VITALS — BP 112/78 | Ht 67.0 in | Wt 176.8 lb

## 2013-11-19 DIAGNOSIS — F341 Dysthymic disorder: Secondary | ICD-10-CM

## 2013-11-19 DIAGNOSIS — F419 Anxiety disorder, unspecified: Secondary | ICD-10-CM

## 2013-11-19 DIAGNOSIS — R5381 Other malaise: Secondary | ICD-10-CM

## 2013-11-19 DIAGNOSIS — F329 Major depressive disorder, single episode, unspecified: Secondary | ICD-10-CM

## 2013-11-19 DIAGNOSIS — M62838 Other muscle spasm: Secondary | ICD-10-CM

## 2013-11-19 DIAGNOSIS — R5383 Other fatigue: Secondary | ICD-10-CM

## 2013-11-19 DIAGNOSIS — K2990 Gastroduodenitis, unspecified, without bleeding: Principal | ICD-10-CM

## 2013-11-19 DIAGNOSIS — N951 Menopausal and female climacteric states: Secondary | ICD-10-CM

## 2013-11-19 DIAGNOSIS — R1013 Epigastric pain: Secondary | ICD-10-CM

## 2013-11-19 DIAGNOSIS — K2970 Gastritis, unspecified, without bleeding: Secondary | ICD-10-CM | POA: Insufficient documentation

## 2013-11-19 DIAGNOSIS — R232 Flushing: Secondary | ICD-10-CM

## 2013-11-19 LAB — BASIC METABOLIC PANEL
BUN: 11 mg/dL (ref 6–23)
CHLORIDE: 100 meq/L (ref 96–112)
CO2: 26 meq/L (ref 19–32)
Calcium: 9.7 mg/dL (ref 8.4–10.5)
Creat: 0.77 mg/dL (ref 0.50–1.10)
Glucose, Bld: 92 mg/dL (ref 70–99)
Potassium: 4.2 mEq/L (ref 3.5–5.3)
Sodium: 136 mEq/L (ref 135–145)

## 2013-11-19 LAB — CBC WITH DIFFERENTIAL/PLATELET
BASOS PCT: 0 % (ref 0–1)
Basophils Absolute: 0 10*3/uL (ref 0.0–0.1)
EOS ABS: 0.3 10*3/uL (ref 0.0–0.7)
Eosinophils Relative: 4 % (ref 0–5)
HEMATOCRIT: 41.5 % (ref 36.0–46.0)
HEMOGLOBIN: 14.3 g/dL (ref 12.0–15.0)
Lymphocytes Relative: 20 % (ref 12–46)
Lymphs Abs: 1.3 10*3/uL (ref 0.7–4.0)
MCH: 31.8 pg (ref 26.0–34.0)
MCHC: 34.5 g/dL (ref 30.0–36.0)
MCV: 92.2 fL (ref 78.0–100.0)
MONOS PCT: 10 % (ref 3–12)
Monocytes Absolute: 0.7 10*3/uL (ref 0.1–1.0)
Neutro Abs: 4.4 10*3/uL (ref 1.7–7.7)
Neutrophils Relative %: 66 % (ref 43–77)
Platelets: 240 10*3/uL (ref 150–400)
RBC: 4.5 MIL/uL (ref 3.87–5.11)
RDW: 13.1 % (ref 11.5–15.5)
WBC: 6.6 10*3/uL (ref 4.0–10.5)

## 2013-11-19 LAB — HEPATIC FUNCTION PANEL
ALBUMIN: 4.7 g/dL (ref 3.5–5.2)
ALT: 26 U/L (ref 0–35)
AST: 31 U/L (ref 0–37)
Alkaline Phosphatase: 65 U/L (ref 39–117)
BILIRUBIN DIRECT: 0.1 mg/dL (ref 0.0–0.3)
Indirect Bilirubin: 0.6 mg/dL (ref 0.2–1.2)
Total Bilirubin: 0.7 mg/dL (ref 0.2–1.2)
Total Protein: 7.2 g/dL (ref 6.0–8.3)

## 2013-11-19 LAB — LIPASE

## 2013-11-19 LAB — TSH: TSH: 1.803 u[IU]/mL (ref 0.350–4.500)

## 2013-11-19 MED ORDER — SUCRALFATE 1 GM/10ML PO SUSP: 1.0000 g | Freq: Three times a day (TID) | ORAL | Status: DC

## 2013-11-19 MED ORDER — ONDANSETRON 8 MG PO TBDP: 8.0000 mg | ORAL_TABLET | Freq: Three times a day (TID) | ORAL | Status: DC | PRN

## 2013-11-19 MED ORDER — METHOCARBAMOL 750 MG PO TABS: 750.0000 mg | ORAL_TABLET | Freq: Three times a day (TID) | ORAL | Status: DC

## 2013-11-19 MED ORDER — ALPRAZOLAM 1 MG PO TABS: 1.0000 mg | ORAL_TABLET | Freq: Two times a day (BID) | ORAL | Status: DC | PRN

## 2013-11-19 MED ORDER — PROMETHAZINE HCL 25 MG/ML IJ SOLN
25.0000 mg | Freq: Once | INTRAMUSCULAR | Status: AC
  Administered 2013-11-19: 25 mg via INTRAMUSCULAR

## 2013-11-19 NOTE — Progress Notes (Signed)
Subjective:  Presents with her partner for multiple issues. Complaints of nausea, severe over the past week. Currently on Protonix twice a day for reflux, states symptoms come and go depending on what she eats. She and her partner had a viral illness last week which has resolved. No known contact with contaminated food or water. Constant nausea, no vomiting. No diarrhea or constipation. Stools normal color. No blood in her stool. Increase burping and bloating. No fever. Has had frequent hot flashes, stopped her hormone therapy prescribed by her GYN provider about 2 months ago due to cost. Has been taking One-a-Day menopause supplement. Having significant hot flashes at times. Mild generalized lower abdominal pain at times. Extreme fatigue. No relief of anxiety with Xanax 0.5 mg half tab twice a day. Nonsmoker. Moderate alcohol use. No caffeine intake. Has been taking naproxen at times for her right sided localized neck pain that is been going on for over a month. No specific history of injury. Does not radiate down her arm. No numbness or weakness of the arms. No relief with naproxen. Has been under extreme stress especially due to financial issues. PMH includes cholecystectomy.  Objective:   BP 112/78  Ht 5\' 7"  (1.702 m)  Wt 176 lb 12.8 oz (80.196 kg)  BMI 27.68 kg/m2 NAD. Alert, oriented.  Lungs clear. Heart regular rate rhythm. Abdomen soft nondistended with active bowel sounds x4; distinct epigastric area tenderness. Exam otherwise benign. Extremely tight tender muscles noted along the lateral and cervical area the neck more so on the right side into the trapezius and rhomboids. Good ROM of the neck with mild tenderness towards right lateral movement. Upper extremity strength 5+ bilaterally. Sensation grossly intact. Strong radial pulses bilaterally.   Assessment: Problem List Items Addressed This Visit     Digestive   Unspecified gastritis and gastroduodenitis without mention of hemorrhage -  Primary   Relevant Medications      promethazine (PHENERGAN) injection 25 mg (Completed)     Other   Hot flashes    Other Visit Diagnoses   Abdominal pain, epigastric        Relevant Orders       Basic metabolic panel       CBC with Differential       Hepatic function panel       Lipase    Other malaise and fatigue        Relevant Orders       Basic metabolic panel       CBC with Differential       Hepatic function panel       Lipase       TSH    Muscle spasms of head or neck        Anxiety and depression            Plan: Meds ordered this encounter  Medications  . sucralfate (CARAFATE) 1 GM/10ML suspension    Sig: Take 10 mLs (1 g total) by mouth 4 (four) times daily -  with meals and at bedtime.    Dispense:  420 mL    Refill:  0    Order Specific Question:  Supervising Provider    Answer:  Merlyn Albert [[id number]]  . ondansetron (ZOFRAN-ODT) 8 MG disintegrating tablet    Sig: Take 1 tablet (8 mg total) by mouth every 8 (eight) hours as needed for nausea or vomiting.    Dispense:  30 tablet    Refill:  0  Order Specific Question:  Supervising Provider    Answer:  Merlyn AlbertLUKING, WILLIAM S [[id number]]  . methocarbamol (ROBAXIN) 750 MG tablet    Sig: Take 1 tablet (750 mg total) by mouth 3 (three) times daily. Prn muscle spasms    Dispense:  30 tablet    Refill:  0    Order Specific Question:  Supervising Provider    Answer:  Merlyn AlbertLUKING, WILLIAM S [[id number]]  . ALPRAZolam (XANAX) 1 MG tablet    Sig: Take 1 tablet (1 mg total) by mouth 2 (two) times daily as needed for anxiety.    Dispense:  60 tablet    Refill:  2    May refill monthly    Order Specific Question:  Supervising Provider    Answer:  Merlyn AlbertLUKING, WILLIAM S [[id number]]  . promethazine (PHENERGAN) injection 25 mg    Sig:     Refer to GI specialist for further evaluation. Note patient is also due for her routine screening colonoscopy as of this week.  For neck pain: TENS unit (Icy hot smart relief); massage therapy; neck  exercises; chiropractor; ice/heat applications Natural supplements for hormones: black cohosh, soy and flax seed  Given written and verbal information on reflux disease. Given prescription for GI cocktail for severe symptoms. Warning signs reviewed. Call back in 5-7 days if no improvement, sooner if worse.

## 2013-11-19 NOTE — Telephone Encounter (Signed)
Patient scheduled appt for today for nausea

## 2013-11-19 NOTE — Patient Instructions (Signed)
    For neck pain: TENS unit (Icy hot smart relief); massage therapy; neck exercises; chiropractor; ice/heat applications Natural supplements for hormones: black cohosh, soy and flaxseed     Diet for Gastroesophageal Reflux Disease, Adult Reflux (acid reflux) is when acid from your stomach flows up into the esophagus. When acid comes in contact with the esophagus, the acid causes irritation and soreness (inflammation) in the esophagus. When reflux happens often or so severely that it causes damage to the esophagus, it is called gastroesophageal reflux disease (GERD). Nutrition therapy can help ease the discomfort of GERD. FOODS OR DRINKS TO AVOID OR LIMIT  Smoking or chewing tobacco. Nicotine is one of the most potent stimulants to acid production in the gastrointestinal tract.  Caffeinated and decaffeinated coffee and black tea.  Regular or low-calorie carbonated beverages or energy drinks (caffeine-free carbonated beverages are allowed).   Strong spices, such as black pepper, white pepper, red pepper, cayenne, curry powder, and chili powder.  Peppermint or spearmint.  Chocolate.  High-fat foods, including meats and fried foods. Extra added fats including oils, butter, salad dressings, and nuts. Limit these to less than 8 tsp per day.  Fruits and vegetables if they are not tolerated, such as citrus fruits or tomatoes.  Alcohol.  Any food that seems to aggravate your condition. If you have questions regarding your diet, call your caregiver or a registered dietitian. OTHER THINGS THAT MAY HELP GERD INCLUDE:   Eating your meals slowly, in a relaxed setting.  Eating 5 to 6 small meals per day instead of 3 large meals.  Eliminating food for a period of time if it causes distress.  Not lying down until 3 hours after eating a meal.  Keeping the head of your bed raised 6 to 9 inches (15 to 23 cm) by using a foam wedge or blocks under the legs of the bed. Lying flat may make  symptoms worse.  Being physically active. Weight loss may be helpful in reducing reflux in overweight or obese adults.  Wear loose fitting clothing EXAMPLE MEAL PLAN This meal plan is approximately 2,000 calories based on https://www.[place].org/ChooseMyPlate.gov meal planning guidelines. Breakfast   cup cooked oatmeal.  1 cup strawberries.  1 cup low-fat milk.  1 oz almonds. Snack  1 cup cucumber slices.  6 oz yogurt (made from low-fat or fat-free milk). Lunch  2 slice whole-wheat bread.  2 oz sliced [place].  2 tsp mayonnaise.  1 cup blueberries.  1 cup snap peas. Snack  6 whole-wheat crackers.  1 oz string cheese. Dinner   cup brown rice.  1 cup mixed veggies.  1 tsp olive oil.  3 oz grilled fish. Document Released: 09/05/2005 Document Revised: 11/28/2011 Document Reviewed: 07/22/2011 [facility] Patient Information 2014 [place], [place].

## 2013-11-21 ENCOUNTER — Encounter: Payer: Self-pay | Admitting: Nurse Practitioner

## 2013-11-27 ENCOUNTER — Encounter: Payer: Self-pay | Admitting: Gastroenterology

## 2013-11-29 ENCOUNTER — Other Ambulatory Visit: Payer: Self-pay | Admitting: Nurse Practitioner

## 2013-12-02 ENCOUNTER — Other Ambulatory Visit: Payer: Self-pay | Admitting: Nurse Practitioner

## 2013-12-03 ENCOUNTER — Telehealth: Payer: Self-pay | Admitting: Family Medicine

## 2013-12-03 NOTE — Telephone Encounter (Signed)
Unable to get prior auth for pt's GI COCKTAIL, with no specific name or NDC# insurance company can't look it up to authorize, [facility]

## 2013-12-03 NOTE — Telephone Encounter (Signed)
Rx prior auth obtained for pt's PANTOPRAZOLE 40mg  BID, expires 11/20/14 through [facility], [facility]

## 2013-12-19 ENCOUNTER — Other Ambulatory Visit: Payer: Self-pay | Admitting: Nurse Practitioner

## 2013-12-20 NOTE — Telephone Encounter (Signed)
Seen 11/19/13

## 2013-12-26 ENCOUNTER — Ambulatory Visit (INDEPENDENT_AMBULATORY_CARE_PROVIDER_SITE_OTHER): Payer: BC Managed Care – PPO | Admitting: Gastroenterology

## 2013-12-26 ENCOUNTER — Other Ambulatory Visit: Payer: Self-pay | Admitting: Gastroenterology

## 2013-12-26 ENCOUNTER — Encounter: Payer: Self-pay | Admitting: Gastroenterology

## 2013-12-26 ENCOUNTER — Encounter (INDEPENDENT_AMBULATORY_CARE_PROVIDER_SITE_OTHER): Payer: Self-pay

## 2013-12-26 VITALS — BP 110/71 | HR 69 | Temp 97.4°F | Ht 68.0 in | Wt 182.2 lb

## 2013-12-26 DIAGNOSIS — R1013 Epigastric pain: Secondary | ICD-10-CM

## 2013-12-26 DIAGNOSIS — Z1211 Encounter for screening for malignant neoplasm of colon: Secondary | ICD-10-CM | POA: Insufficient documentation

## 2013-12-26 DIAGNOSIS — K3189 Other diseases of stomach and duodenum: Secondary | ICD-10-CM

## 2013-12-26 MED ORDER — PEG-KCL-NACL-NASULF-NA ASC-C 100 G PO SOLR: 1.0000 | ORAL | Status: DC

## 2013-12-26 NOTE — Progress Notes (Addendum)
   Subjective:    Patient ID: Robin Dixon, female    DOB: 03/27/1966, 48 y.o.   MRN: [id number]  LUKING,SCOTT, MD  HPI Sx off and on for years: GERD/NAUSEA. OTHER WEEK THAT MON AM. FELT NAUSEAOUS ALL DAY. TUES AM STILL NAUSEA. TOOK CARFATE QID AND FELT BETTER, NOW JUST AS NEEDED. ON PROTONIX FOR A LONG TIME BUT WAS ONLY TAKING QD AND AFTER EPISODE NOW TAKING BID EVERY DAY. HEARTBURN Sx: SOMETIMES BURNING BUT MOST OF THE TIME IT'S JUST NAUSEA. NOT USING MEDS FOR NAUSEA. ALWAYS HAS HAD CONSTIPATION. BMs: MAY GO A  WEEK AND SOMETIMES MAY GO EVERY DAY. TRIED AMITIZA-MAKES HER NAUSEAOUS. MAY DRINK PRUNE JUICE. MAY HAVE ABD PAIN:1-2X/MO. NO WEIGHT LOSS.  NO TOBACCO. OCCASIONAL BEER. ASA 81 MG DAILY. NO BC/GOODY POWDERS, IBUPROFEN/MOTRIN, OR NAPROXEN/ALEVE. PT DENIES FEVER, CHILLS, BRBPR, vomiting, melena, diarrhea, OR problems swallowing, problems with sedation. NEVER HAD A TCS.  Past Medical History  Diagnosis Date  . Hypertension   . Anxiety 03/29/2013  . Hot flashes 03/29/2013  . PMB (postmenopausal bleeding) 06/12/2013    US WNL   Past Surgical History  Procedure Laterality Date  . Cholecystectomy     No Known Allergies  Current Outpatient Prescriptions  Medication Sig Dispense Refill  . ALPRAZolam (XANAX) 1 MG tablet Take 1 tablet (1 mg total) by mouth 2 (two) times daily as needed for anxiety.    . aspirin 81 MG tablet Take 81 mg by mouth daily.    . CARAFATE 1 GM/10ML suspension TAKE 10 MLS BY MOUTH FOUR TIMES A DAY WITH MEALS AND AT BEDTIME    . escitalopram (LEXAPRO) 20 MG tablet TAKE ONE TABLET BY MOUTH ONCE DAILY    . Estradiol 0.52 MG/0.87 GM (0.06%) GEL Apply 1 application topically daily.    . methocarbamol (ROBAXIN) 750 MG tablet TAKE ONE TABLET BY MOUTH THREE TIMES DAILY AS NEEDED FOR MUSCLE SPASM    . metoprolol (LOPRESSOR) 50 MG tablet TAKE ONE-HALF TABLET BY MOUTH TWICE DAILY    . naproxen (NAPROSYN) 500 MG tablet TAKE ONE TABLET BY MOUTH TWICE DAILY *TAKE WITH MEALS*      . ondansetron (ZOFRAN-ODT) 8 MG disintegrating tablet Take 1 tablet (8 mg total) by mouth every 8 (eight) hours as needed for nausea or vomiting.    . pantoprazole (PROTONIX) 40 MG tablet TAKE ONE TABLET BY MOUTH TWICE DAILY    . progesterone (PROMETRIUM) 200 MG capsule Take 1 capsule (200 mg total) by mouth daily.         Review of Systems PER HPI OTHERWISE ALL SYSTEMS ARE NEGATIVE.    Objective:   Physical Exam  Vitals reviewed. Constitutional: She is oriented to person, place, and time. She appears well-nourished. No distress.  HENT:  Head: Normocephalic and atraumatic.  Mouth/Throat: Oropharynx is clear and moist. No oropharyngeal exudate.  Eyes: Pupils are equal, round, and reactive to light.  Neck: Normal range of motion. Neck supple.  Cardiovascular: Normal rate, regular rhythm and normal heart sounds.   Pulmonary/Chest: Effort normal and breath sounds normal. No respiratory distress.  Abdominal: Soft. Bowel sounds are normal. She exhibits no distension.  Musculoskeletal: She exhibits no edema.  Lymphadenopathy:    She has no cervical adenopathy.  Neurological: She is alert and oriented to person, place, and time.  NO FOCAL DEFICITS   Psychiatric: She has a normal mood and affect.          Assessment & Plan:   

## 2013-12-26 NOTE — Progress Notes (Signed)
Reminder in epic °

## 2013-12-26 NOTE — Patient Instructions (Signed)
CONTINUE PROTONIX. TAKE 30 MINUTES PRIOR TO MEALS TWICE DAILY.  UPPER AND LOW ENDOSCOPY APR 10.  FOLLOW A FULL LIQUID DIET TODAY.  FOLLOW UP IN 3 MOS.    Full Liquid Diet A high-calorie, high-protein supplement should be used to meet your nutritional requirements when the full liquid diet is continued for more than 2 or 3 days. If this diet is to be used for an extended period of time (more than 7 days), a multivitamin should be considered.  Breads and Starches  Allowed: None are allowed except crackers WHOLE OR pureed (made into a thick, smooth soup) in soup. Cooked, refined corn, oat, rice, rye, and wheat cereals are also allowed.   Avoid: Any others.    Potatoes/Pasta/Rice  Allowed: ANY ITEM AS A SOUP OR SMALL PLATE OF MASHED POTATOES OR RICE.       Vegetables  Allowed: Strained tomato or vegetable juice. Vegetables pureed in soup.   Avoid: Any others.    Fruit  Allowed: Any strained fruit juices and fruit drinks. Include 1 serving of citrus or vitamin C-enriched fruit juice daily.   Avoid: Any others.  Meat and Meat Substitutes  Allowed: Egg  Avoid: Any meat, fish, or fowl. All cheese.  Milk  Allowed: Milk beverages, including milk shakes and instant breakfast mixes. Smooth yogurt.   Avoid: Any others. Avoid dairy products if not tolerated.    Soups and Combination Foods  Allowed: Broth, strained cream soups. Strained, broth-based soups.   Avoid: Any others.    Desserts and Sweets  Allowed: flavored gelatin, tapioca, plain ice cream, sherbet, smooth pudding, junket, fruit ices, frozen ice pops, pudding pops,, frozen fudge pops, chocolate syrup. Sugar, honey, jelly, syrup.   Avoid: Any others.  Fats and Oils  Allowed: Margarine, butter, cream, sour cream, oils.   Avoid: Any others.  Beverages  Allowed: All.   Avoid: None.  Condiments  Allowed: Iodized salt, pepper, spices, flavorings. Cocoa powder.   Avoid: Any others.    SAMPLE MEAL  PLAN Breakfast   cup orange juice.   1 cup cooked wheat cereal.   1 cup  milk.   1 cup beverage (coffee or tea).   Cream or sugar, if desired.    Midmorning Snack  2 SCRAMBLED OR HARD BOILED EGG   Lunch  1 cup cream soup.    cup fruit juice.   1 cup milk.    cup custard.   1 cup beverage (coffee or tea).   Cream or sugar, if desired.    Midafternoon Snack  1 cup milk shake.  Dinner  1 cup cream soup.    cup fruit juice.   1 cup milk.    cup pudding.   1 cup beverage (coffee or tea).   Cream or sugar, if desired.  Evening Snack  1 cup supplement.  To increase calories, add sugar, cream, butter, or margarine if possible. Nutritional supplements will also increase the total calories.

## 2013-12-26 NOTE — Assessment & Plan Note (Signed)
NEW ONSET MOST LIKELY DUE TO UNCONTROLLED GERD, AND/OR H PYLORI GASTRITIS, LESS LIKELY PUD, OR ESO/GASTRIC CA  EGD APR 10 PROTONIX BID LOW FAT DIET OPV IN 3 MOS

## 2013-12-26 NOTE — Assessment & Plan Note (Signed)
AVERAGE RISK  TCS APR 10 MOVIPREP FULL LIQUID DIET PHENERGAN 12.5 MG IV IN PREOP

## 2013-12-26 NOTE — Progress Notes (Signed)
cc'd to pcp 

## 2013-12-27 ENCOUNTER — Ambulatory Visit (HOSPITAL_COMMUNITY)
Admission: RE | Admit: 2013-12-27 | Discharge: 2013-12-27 | Disposition: A | Discharge status: Home / Self Care | Payer: BC Managed Care – PPO | Source: Ambulatory Visit | Attending: Gastroenterology | Admitting: Gastroenterology

## 2013-12-27 ENCOUNTER — Encounter (HOSPITAL_COMMUNITY): Payer: Self-pay | Admitting: *Deleted

## 2013-12-27 ENCOUNTER — Encounter (HOSPITAL_COMMUNITY)
Admission: RE | Disposition: A | Discharge status: Home / Self Care | Payer: Self-pay | Source: Ambulatory Visit | Attending: Gastroenterology

## 2013-12-27 DIAGNOSIS — K449 Diaphragmatic hernia without obstruction or gangrene: Secondary | ICD-10-CM | POA: Insufficient documentation

## 2013-12-27 DIAGNOSIS — I10 Essential (primary) hypertension: Secondary | ICD-10-CM | POA: Insufficient documentation

## 2013-12-27 DIAGNOSIS — K2960 Other gastritis without bleeding: Secondary | ICD-10-CM | POA: Insufficient documentation

## 2013-12-27 DIAGNOSIS — Z7982 Long term (current) use of aspirin: Secondary | ICD-10-CM | POA: Insufficient documentation

## 2013-12-27 DIAGNOSIS — Z1211 Encounter for screening for malignant neoplasm of colon: Secondary | ICD-10-CM | POA: Insufficient documentation

## 2013-12-27 DIAGNOSIS — K3189 Other diseases of stomach and duodenum: Secondary | ICD-10-CM

## 2013-12-27 DIAGNOSIS — K648 Other hemorrhoids: Secondary | ICD-10-CM | POA: Insufficient documentation

## 2013-12-27 DIAGNOSIS — F411 Generalized anxiety disorder: Secondary | ICD-10-CM | POA: Insufficient documentation

## 2013-12-27 DIAGNOSIS — K5730 Diverticulosis of large intestine without perforation or abscess without bleeding: Secondary | ICD-10-CM | POA: Insufficient documentation

## 2013-12-27 DIAGNOSIS — Z79899 Other long term (current) drug therapy: Secondary | ICD-10-CM | POA: Insufficient documentation

## 2013-12-27 DIAGNOSIS — K319 Disease of stomach and duodenum, unspecified: Secondary | ICD-10-CM | POA: Insufficient documentation

## 2013-12-27 DIAGNOSIS — R1013 Epigastric pain: Secondary | ICD-10-CM

## 2013-12-27 HISTORY — PX: ESOPHAGOGASTRODUODENOSCOPY: SHX5428

## 2013-12-27 HISTORY — PX: COLONOSCOPY: SHX5424

## 2013-12-27 SURGERY — COLONOSCOPY
Anesthesia: Moderate Sedation

## 2013-12-27 MED ORDER — LIDOCAINE VISCOUS 2 % MT SOLN
OROMUCOSAL | Status: DC | PRN
  Administered 2013-12-27: 1 via OROMUCOSAL

## 2013-12-27 MED ORDER — STERILE WATER FOR IRRIGATION IR SOLN
Status: DC | PRN
  Administered 2013-12-27: 13:00:00

## 2013-12-27 MED ORDER — LIDOCAINE VISCOUS 2 % MT SOLN
OROMUCOSAL | Status: AC
  Filled 2013-12-27: qty 15

## 2013-12-27 MED ORDER — MIDAZOLAM HCL 5 MG/5ML IJ SOLN
INTRAMUSCULAR | Status: DC | PRN
  Administered 2013-12-27: 2 mg via INTRAVENOUS
  Administered 2013-12-27: 1 mg via INTRAVENOUS
  Administered 2013-12-27: 2 mg via INTRAVENOUS
  Administered 2013-12-27: 1 mg via INTRAVENOUS

## 2013-12-27 MED ORDER — SODIUM CHLORIDE 0.9 % IJ SOLN
INTRAMUSCULAR | Status: AC
  Filled 2013-12-27: qty 10

## 2013-12-27 MED ORDER — MEPERIDINE HCL 100 MG/ML IJ SOLN
INTRAMUSCULAR | Status: DC | PRN
  Administered 2013-12-27 (×2): 25 mg via INTRAVENOUS
  Administered 2013-12-27: 50 mg via INTRAVENOUS

## 2013-12-27 MED ORDER — MIDAZOLAM HCL 5 MG/5ML IJ SOLN
INTRAMUSCULAR | Status: AC
  Filled 2013-12-27: qty 10

## 2013-12-27 MED ORDER — PROMETHAZINE HCL 25 MG/ML IJ SOLN
INTRAMUSCULAR | Status: AC
  Filled 2013-12-27: qty 1

## 2013-12-27 MED ORDER — MEPERIDINE HCL 100 MG/ML IJ SOLN
INTRAMUSCULAR | Status: AC
  Filled 2013-12-27: qty 2

## 2013-12-27 MED ORDER — PROMETHAZINE HCL 25 MG/ML IJ SOLN
12.5000 mg | Freq: Once | INTRAMUSCULAR | Status: AC
  Administered 2013-12-27: 12.5 mg via INTRAVENOUS

## 2013-12-27 MED ORDER — SODIUM CHLORIDE 0.9 % IV SOLN
INTRAVENOUS | Status: DC
  Administered 2013-12-27: 13:00:00 via INTRAVENOUS

## 2013-12-27 NOTE — Discharge Instructions (Signed)
You have SMALL internal hemorrhoids & MILD diverticulosis IN YOUR LEFT COLON. You have MILD gastritis. I biopsied your stomach.   CONTINUE PROTONIX. TAKE 30 MINUTES PRIOR TO MEALS TWICE DAILY.  AVOID ITEMS THAT TRIGGER GASTRITIS.  FOLLOW A HIGH FIBER/LOW FAT DIET. AVOID ITEMS THAT CAUSE BLOATING. SEE INFO BELOW.  YOUR BIOPSY RESULTS WILL BE AVAILABLE IN 10 DAYS.  FOLLOW UP IN JUL 2015.  Next colonoscopy in 10 years.    ENDOSCOPY Care After Read the instructions outlined below and refer to this sheet in the next week. These discharge instructions provide you with general information on caring for yourself after you leave the hospital. While your treatment has been planned according to the most current medical practices available, unavoidable complications occasionally occur. If you have any problems or questions after discharge, call DR. Parry Po, [phone].  ACTIVITY  You may resume your regular activity, but move at a slower pace for the next 24 hours.   Take frequent rest periods for the next 24 hours.   Walking will help get rid of the air and reduce the bloated feeling in your belly (abdomen).   No driving for 24 hours (because of the medicine (anesthesia) used during the test).   You may shower.   Do not sign any important legal documents or operate any machinery for 24 hours (because of the anesthesia used during the test).    NUTRITION  Drink plenty of fluids.   You may resume your normal diet as instructed by your doctor.   Begin with a light meal and progress to your normal diet. Heavy or fried foods are harder to digest and may make you feel sick to your stomach (nauseated).   Avoid alcoholic beverages for 24 hours or as instructed.    MEDICATIONS  You may resume your normal medications.   WHAT YOU CAN EXPECT TODAY  Some feelings of bloating in the abdomen.   Passage of more gas than usual.   Spotting of blood in your stool or on the toilet  paper  .  IF YOU HAD POLYPS REMOVED DURING THE ENDOSCOPY:  Eat a soft diet IF YOU HAVE NAUSEA, BLOATING, ABDOMINAL PAIN, OR VOMITING.    FINDING OUT THE RESULTS OF YOUR TEST Not all test results are available during your visit. DR. Darrick Penna WILL CALL YOU WITHIN 7 DAYS OF YOUR PROCEDUE WITH YOUR RESULTS. Do not assume everything is normal if you have not heard from DR. Custer Pimenta IN ONE WEEK, CALL HER OFFICE AT [phone].  SEEK IMMEDIATE MEDICAL ATTENTION AND CALL THE OFFICE: [phone] IF:  You have more than a spotting of blood in your stool.   Your belly is swollen (abdominal distention).   You are nauseated or vomiting.   You have a temperature over 101F.   You have abdominal pain or discomfort that is severe or gets worse throughout the day.   Gastritis  Gastritis is an inflammation (the body's way of reacting to injury and/or infection) of the stomach. It is often caused by viral or bacterial (germ) infections. It can also be caused BY ASPIRIN, BC/GOODY POWDER'S, (IBUPROFEN) MOTRIN, OR ALEVE (NAPROXEN), chemicals (including alcohol), SPICY FOODS, and medications. This illness may be associated with generalized malaise (feeling tired, not well), UPPER ABDOMINAL STOMACH cramps, and fever. One common bacterial cause of gastritis is an organism known as H. Pylori. This can be treated with antibiotics.    High-Fiber Diet A high-fiber diet changes your normal diet to include more whole grains, legumes, fruits,  and vegetables. Changes in the diet involve replacing refined carbohydrates with unrefined foods. The calorie level of the diet is essentially unchanged. The Dietary Reference Intake (recommended amount) for adult males is 38 grams per day. For adult females, it is 25 grams per day. Pregnant and lactating women should consume 28 grams of fiber per day. Fiber is the intact part of a plant that is not broken down during digestion. Functional fiber is fiber that has been isolated from  the plant to provide a beneficial effect in the body. PURPOSE  Increase stool bulk.   Ease and regulate bowel movements.   Lower cholesterol.  INDICATIONS THAT YOU NEED MORE FIBER  Constipation and hemorrhoids.   Uncomplicated diverticulosis (intestine condition) and irritable bowel syndrome.   Weight management.   As a protective measure against hardening of the arteries (atherosclerosis), diabetes, and cancer.   GUIDELINES FOR INCREASING FIBER IN THE DIET  Start adding fiber to the diet slowly. A gradual increase of about 5 more grams (2 slices of whole-wheat bread, 2 servings of most fruits or vegetables, or 1 bowl of high-fiber cereal) per day is best. Too rapid an increase in fiber may result in constipation, flatulence, and bloating.   Drink enough water and fluids to keep your urine clear or pale yellow. Water, juice, or caffeine-free drinks are recommended. Not drinking enough fluid may cause constipation.   Eat a variety of high-fiber foods rather than one type of fiber.   Try to increase your intake of fiber through using high-fiber foods rather than fiber pills or supplements that contain small amounts of fiber.   The goal is to change the types of food eaten. Do not supplement your present diet with high-fiber foods, but replace foods in your present diet.  INCLUDE A VARIETY OF FIBER SOURCES  Replace refined and processed grains with whole grains, canned fruits with fresh fruits, and incorporate other fiber sources. White rice, white breads, and most bakery goods contain little or no fiber.   Brown whole-grain rice, buckwheat oats, and many fruits and vegetables are all good sources of fiber. These include: broccoli, Brussels sprouts, cabbage, cauliflower, beets, sweet potatoes, white potatoes (skin on), carrots, tomatoes, eggplant, squash, berries, fresh fruits, and dried fruits.   Cereals appear to be the richest source of fiber. Cereal fiber is found in whole grains  and bran. Bran is the fiber-rich outer coat of cereal grain, which is largely removed in refining. In whole-grain cereals, the bran remains. In breakfast cereals, the largest amount of fiber is found in those with "bran" in their names. The fiber content is sometimes indicated on the label.   You may need to include additional fruits and vegetables each day.   In baking, for 1 cup white flour, you may use the following substitutions:   1 cup whole-wheat flour minus 2 tablespoons.   1/2 cup white flour plus 1/2 cup whole-wheat flour.   Low-Fat Diet BREADS, CEREALS, PASTA, RICE, DRIED PEAS, AND BEANS These products are high in carbohydrates and most are low in fat. Therefore, they can be increased in the diet as substitutes for fatty foods. They too, however, contain calories and should not be eaten in excess. Cereals can be eaten for snacks as well as for breakfast.  Include foods that contain fiber (fruits, vegetables, whole grains, and legumes). Research shows that fiber may lower blood cholesterol levels, especially the water-soluble fiber found in fruits, vegetables, oat products, and legumes. FRUITS AND VEGETABLES It is good  to eat fruits and vegetables. Besides being sources of fiber, both are rich in vitamins and some minerals. They help you get the daily allowances of these nutrients. Fruits and vegetables can be used for snacks and desserts. MEATS Limit lean meat, chicken, [place], and fish to no more than 6 ounces per day. Beef, Pork, and Lamb Use lean cuts of beef, pork, and lamb. Lean cuts include:  Extra-lean ground beef.  Arm roast.  Sirloin tip.  Center-cut ham.  Round steak.  Loin chops.  Rump roast.  Tenderloin.  Trim all fat off the outside of meats before cooking. It is not necessary to severely decrease the intake of red meat, but lean choices should be made. Lean meat is rich in protein and contains a highly absorbable form of iron. Premenopausal women, in particular,  should avoid reducing lean red meat because this could increase the risk for low red blood cells (iron-deficiency anemia). The organ meats, such as liver, sweetbreads, kidneys, and brain are very rich in cholesterol. They should be limited. Chicken and [place] These are good sources of protein. The fat of poultry can be reduced by removing the skin and underlying fat layers before cooking. Chicken and [place] can be substituted for lean red meat in the diet. Poultry should not be fried or covered with high-fat sauces. Fish and Shellfish Fish is a good source of protein. Shellfish contain cholesterol, but they usually are low in saturated fatty acids. The preparation of fish is important. Like chicken and [place], they should not be fried or covered with high-fat sauces. EGGS Egg whites contain no fat or cholesterol. They can be eaten often. Try 1 to 2 egg whites instead of whole eggs in recipes or use egg substitutes that do not contain yolk. MILK AND DAIRY PRODUCTS Use skim or 1% milk instead of 2% or whole milk. Decrease whole milk, natural, and processed cheeses. Use nonfat or low-fat (2%) cottage cheese or low-fat cheeses made from vegetable oils. Choose nonfat or low-fat (1 to 2%) yogurt. Experiment with evaporated skim milk in recipes that call for heavy cream. Substitute low-fat yogurt or low-fat cottage cheese for sour cream in dips and salad dressings. Have at least 2 servings of low-fat dairy products, such as 2 glasses of skim (or 1%) milk each day to help get your daily calcium intake.  FATS AND OILS Reduce the total intake of fats, especially saturated fat. Butterfat, lard, and beef fats are high in saturated fat and cholesterol. These should be avoided as much as possible. Vegetable fats do not contain cholesterol, but certain vegetable fats, such as coconut oil, palm oil, and palm kernel oil are very high in saturated fats. These should be limited. These fats are often used in bakery goods,  processed foods, popcorn, oils, and nondairy creamers. Vegetable shortenings and some peanut butters contain hydrogenated oils, which are also saturated fats. Read the labels on these foods and check for saturated vegetable oils. Unsaturated vegetable oils and fats do not raise blood cholesterol. However, they should be limited because they are fats and are high in calories. Total fat should still be limited to 30% of your daily caloric intake. Desirable liquid vegetable oils are corn oil, cottonseed oil, olive oil, canola oil, safflower oil, soybean oil, and sunflower oil. Peanut oil is not as good, but small amounts are acceptable. Buy a heart-healthy tub margarine that has no partially hydrogenated oils in the ingredients. Mayonnaise and salad dressings often are made from unsaturated fats, but  they should also be limited because of their high calorie and fat content. Seeds, nuts, peanut butter, olives, and avocados are high in fat, but the fat is mainly the unsaturated type. These foods should be limited mainly to avoid excess calories and fat. OTHER EATING TIPS Snacks  Most sweets should be limited as snacks. They tend to be rich in calories and fats, and their caloric content outweighs their nutritional value. Some good choices in snacks are graham crackers, melba toast, soda crackers, bagels (no egg), English muffins, fruits, and vegetables. These snacks are preferable to snack crackers, [place] fries, and chips. Popcorn should be air-popped or cooked in small amounts of liquid vegetable oil. Desserts Eat fruit, low-fat yogurt, and fruit ices. AVOID pastries, cake, and cookies. Sherbet, angel food cake, gelatin dessert, frozen low-fat yogurt, or other frozen products that do not contain saturated fat (pure fruit juice bars, frozen ice pops) are also acceptable.  COOKING METHODS Choose those methods that use little or no fat. They include: Poaching.  Braising.  Steaming.  Grilling.  Baking.    Stir-frying.  Broiling.  Microwaving.  Foods can be cooked in a nonstick pan without added fat, or use a nonfat cooking spray in regular cookware. Limit fried foods and avoid frying in saturated fat. Add moisture to lean meats by using water, broth, cooking wines, and other nonfat or low-fat sauces along with the cooking methods mentioned above. Soups and stews should be chilled after cooking. The fat that forms on top after a few hours in the refrigerator should be skimmed off. When preparing meals, avoid using excess salt. Salt can contribute to raising blood pressure in some people. EATING AWAY FROM HOME Order entres, potatoes, and vegetables without sauces or butter. When meat exceeds the size of a deck of cards (3 to 4 ounces), the rest can be taken home for another meal. Choose vegetable or fruit salads and ask for low-calorie salad dressings to be served on the side. Use dressings sparingly. Limit high-fat toppings, such as bacon, crumbled eggs, cheese, sunflower seeds, and olives. Ask for heart-healthy tub margarine instead of butter.   Diverticulosis Diverticulosis is a common condition that develops when small pouches (diverticula) form in the wall of the colon. The risk of diverticulosis increases with age. It happens more often in people who eat a low-fiber diet. Most individuals with diverticulosis have no symptoms. Those individuals with symptoms usually experience belly (abdominal) pain, constipation, or loose stools (diarrhea).  HOME CARE INSTRUCTIONS  Increase the amount of fiber in your diet as directed by your caregiver or dietician. This may reduce symptoms of diverticulosis.   Drink at least 6 to 8 glasses of water each day to prevent constipation.   Try not to strain when you have a bowel movement.   Avoiding nuts and seeds to prevent complications is still an uncertain benefit.       FOODS HAVING HIGH FIBER CONTENT INCLUDE:  Fruits. Apple, peach, pear, tangerine,  raisins, prunes.   Vegetables. Brussels sprouts, asparagus, broccoli, cabbage, carrot, cauliflower, romaine lettuce, spinach, summer squash, tomato, winter squash, zucchini.   Starchy Vegetables. Baked beans, kidney beans, lima beans, split peas, lentils, potatoes (with skin).   Grains. Whole wheat bread, brown rice, bran flake cereal, plain oatmeal, white rice, shredded wheat, bran muffins.    SEEK IMMEDIATE MEDICAL CARE IF:  You develop increasing pain or severe bloating.   You have an oral temperature above 101F.   You develop vomiting or bowel movements that  are bloody or black.    Hemorrhoids Hemorrhoids are dilated (enlarged) veins around the rectum. Sometimes clots will form in the veins. This makes them swollen and painful. These are called thrombosed hemorrhoids. Causes of hemorrhoids include:  Constipation.   Straining to have a bowel movement.   HEAVY LIFTING HOME CARE INSTRUCTIONS  Eat a well balanced diet and drink 6 to 8 glasses of water every day to avoid constipation. You may also use a bulk laxative.   Avoid straining to have bowel movements.   Keep anal area dry and clean.   Do not use a donut shaped pillow or sit on the toilet for long periods. This increases blood pooling and pain.   Move your bowels when your body has the urge; this will require less straining and will decrease pain and pressure.

## 2013-12-27 NOTE — H&P (View-Only) (Signed)
   Subjective:    Patient ID: Robin Dixon, female    DOB: 01/24/1966, [age] y.o.   MRN: [id number]  Lilyan PuntLUKING,SCOTT, MD  HPI Sx off and on for years: GERD/NAUSEA. OTHER WEEK THAT MON AM. FELT NAUSEAOUS ALL DAY. TUES AM STILL NAUSEA. TOOK CARFATE QID AND FELT BETTER, NOW JUST AS NEEDED. ON PROTONIX FOR A LONG TIME BUT WAS ONLY TAKING QD AND AFTER EPISODE NOW TAKING BID EVERY DAY. HEARTBURN Sx: SOMETIMES BURNING BUT MOST OF THE TIME IT'S JUST NAUSEA. NOT USING MEDS FOR NAUSEA. ALWAYS HAS HAD CONSTIPATION. BMs: MAY GO A  WEEK AND SOMETIMES MAY GO EVERY DAY. TRIED AMITIZA-MAKES HER NAUSEAOUS. MAY DRINK PRUNE JUICE. MAY HAVE ABD PAIN:1-2X/MO. NO WEIGHT LOSS.  NO TOBACCO. OCCASIONAL BEER. ASA 81 MG DAILY. NO BC/GOODY POWDERS, IBUPROFEN/MOTRIN, OR NAPROXEN/ALEVE. PT DENIES FEVER, CHILLS, BRBPR, vomiting, melena, diarrhea, OR problems swallowing, problems with sedation. NEVER HAD A TCS.  Past Medical History  Diagnosis Date  . Hypertension   . Anxiety 03/29/2013  . Hot flashes 03/29/2013  . PMB (postmenopausal bleeding) 06/12/2013    US WNL   Past Surgical History  Procedure Laterality Date  . Cholecystectomy     No Known Allergies  Current Outpatient Prescriptions  Medication Sig Dispense Refill  . ALPRAZolam (XANAX) 1 MG tablet Take 1 tablet (1 mg total) by mouth 2 (two) times daily as needed for anxiety.    Marland Kitchen. aspirin 81 MG tablet Take 81 mg by mouth daily.    Marland Kitchen. CARAFATE 1 GM/10ML suspension TAKE 10 MLS BY MOUTH FOUR TIMES A DAY WITH MEALS AND AT BEDTIME    . escitalopram (LEXAPRO) 20 MG tablet TAKE ONE TABLET BY MOUTH ONCE DAILY    . Estradiol 0.52 MG/0.87 GM (0.06%) GEL Apply 1 application topically daily.    . methocarbamol (ROBAXIN) 750 MG tablet TAKE ONE TABLET BY MOUTH THREE TIMES DAILY AS NEEDED FOR MUSCLE SPASM    . metoprolol (LOPRESSOR) 50 MG tablet TAKE ONE-HALF TABLET BY MOUTH TWICE DAILY    . naproxen (NAPROSYN) 500 MG tablet TAKE ONE TABLET BY MOUTH TWICE DAILY *TAKE WITH MEALS*      . ondansetron (ZOFRAN-ODT) 8 MG disintegrating tablet Take 1 tablet (8 mg total) by mouth every 8 (eight) hours as needed for nausea or vomiting.    . pantoprazole (PROTONIX) 40 MG tablet TAKE ONE TABLET BY MOUTH TWICE DAILY    . progesterone (PROMETRIUM) 200 MG capsule Take 1 capsule (200 mg total) by mouth daily.         Review of Systems PER HPI OTHERWISE ALL SYSTEMS ARE NEGATIVE.    Objective:   Physical Exam  Vitals reviewed. Constitutional: She is oriented to person, place, and time. She appears well-nourished. No distress.  HENT:  Head: Normocephalic and atraumatic.  Mouth/Throat: Oropharynx is clear and moist. No oropharyngeal exudate.  Eyes: Pupils are equal, round, and reactive to light.  Neck: Normal range of motion. Neck supple.  Cardiovascular: Normal rate, regular rhythm and normal heart sounds.   Pulmonary/Chest: Effort normal and breath sounds normal. No respiratory distress.  Abdominal: Soft. Bowel sounds are normal. She exhibits no distension.  Musculoskeletal: She exhibits no edema.  Lymphadenopathy:    She has no cervical adenopathy.  Neurological: She is alert and oriented to person, place, and time.  NO FOCAL DEFICITS   Psychiatric: She has a normal mood and affect.          Assessment & Plan:

## 2013-12-27 NOTE — Interval H&P Note (Signed)
History and Physical Interval Note:  12/27/2013 1:21 PM  Robin Dixon  has presented today for surgery, with the diagnosis of SCREENING COLONOSCPY AND DYSPEPSIA  The various methods of treatment have been discussed with the patient and family. After consideration of risks, benefits and other options for treatment, the patient has consented to  Procedure(s) with comments: COLONOSCOPY (N/A) - 1:45 ESOPHAGOGASTRODUODENOSCOPY (EGD) (N/A) as a surgical intervention .  The patient's history has been reviewed, patient examined, no change in status, stable for surgery.  I have reviewed the patient's chart and labs.  Questions were answered to the patient's satisfaction.     Sandi L Fields

## 2013-12-30 NOTE — Op Note (Signed)
[facility] [address] [place], [id number]   COLONOSCOPY PROCEDURE REPORT  PATIENT: Robin Dixon, Robin D.  MR#: [id number] BIRTHDATE: 06-Aug-1966 , 48  yrs. old GENDER: Female ENDOSCOPIST: Jonette EvaSandi Fields, MD REFERRED XB:JYNWGBY:Scott Gerda DissLuking, M.D. PROCEDURE DATE:  12/27/2013 PROCEDURE:   Colonoscopy, screening INDICATIONS:Colorectal cancer screening. MEDICATIONS: Demerol 75 mg IV and Versed 5 mg IV  DESCRIPTION OF PROCEDURE:    Physical exam was performed.  Informed consent was obtained from the patient after explaining the benefits, risks, and alternatives to procedure.  The patient was connected to monitor and placed in left lateral position. Continuous oxygen was provided by nasal cannula and IV medicine administered through an indwelling cannula.  After administration of sedation and rectal exam, the patients rectum was intubated and the EG-2990i ([id number]), EC-3890Li ([id number]), and EG-2990i ([id number]) colonoscope was advanced under direct visualization to the ileum. The scope was removed slowly by carefully examining the color, texture, anatomy, and integrity mucosa on the way out.  The patient was recovered in endoscopy and discharged home in satisfactory condition.       COLON FINDINGS: The mucosa appeared normal in the terminal ileum.  , Mild diverticulosis was noted in the sigmoid colon.  , The colon mucosa was otherwise normal.  , and Moderate sized internal hemorrhoids were found.  PREP QUALITY: good. CECAL W/D TIME: 13 minutes  COMPLICATIONS: None  ENDOSCOPIC IMPRESSION: 1.   Normal mucosa in the terminal ileum 2.   Mild diverticulosis was noted in the sigmoid colon 3.   The colon mucosa was otherwise normal 4.   Moderate sized internal hemorrhoids   RECOMMENDATIONS: HIGH FIBER DIET TCS IN 10 YEARS       _______________________________ eSignedJonette Eva:  Sandi Fields, MD 12/30/2013 12:47 PM     PATIENT NAME:  Robin Dixon, Robin D. MR#: [id number]

## 2013-12-30 NOTE — Op Note (Signed)
[facility] [address] [place], [id number]   ENDOSCOPY PROCEDURE REPORT  PATIENT: Robin Dixon, Robin D.  MR#: [id number] BIRTHDATE: 1965-11-28 , 48  yrs. old GENDER: Female  ENDOSCOPIST: Jonette EvaSandi Carlester Kasparek, MD REFERRED FA:OZHYQBY:Scott Gerda DissLuking, M.D.  PROCEDURE DATE: 12/27/2013 PROCEDURE:   EGD w/ biopsy  INDICATIONS:Dyspepsia. MEDICATIONS: Demerol 25 mg IV and Versed 1mg  IV TOPICAL ANESTHETIC:   Viscous Xylocaine  DESCRIPTION OF PROCEDURE:     Physical exam was performed.  Informed consent was obtained from the patient after explaining the benefits, risks, and alternatives to the procedure.  The patient was connected to the monitor and placed in the left lateral position.  Continuous oxygen was provided by nasal cannula and IV medicine administered through an indwelling cannula.  After administration of sedation, the patients esophagus was intubated and the EG-2990i ([id number])  endoscope was advanced under direct visualization to the second portion of the duodenum.  The scope was removed slowly by carefully examining the color, texture, anatomy, and integrity of the mucosa on the way out.  The patient was recovered in endoscopy and discharged home in satisfactory condition.   ESOPHAGUS: The mucosa of the esophagus appeared normal.   A small hiatal hernia was noted.  STOMACH: Mild non-erosive gastritis (inflammation) was found in the gastric antrum.  Multiple biopsies were performed using cold forceps.   DUODENUM: The duodenal mucosa showed no abnormalities in the bulb and second portion of the duodenum. COMPLICATIONS:   None  ENDOSCOPIC IMPRESSION: 1.   The mucosa of the esophagus appeared normal 2.   Small hiatal hernia 3.   Non-erosive gastritis (inflammation) was found in the gastric antrum; multiple biopsies 4.   The duodenal mucosa showed no abnormalities in the bulb and second portion of the duodenum  RECOMMENDATIONS: PROTONIX BID HIGH FIBER/LOW FAT  DIET AWAIT BIOPSY OPV JUL 2015 TCS IN 10 YEARS   REPEAT EXAM:   _______________________________ Rosalie DoctoreSignedJonette Eva:  Blanche Gallien, MD 12/30/2013 12:39 PM

## 2014-01-01 ENCOUNTER — Encounter (HOSPITAL_COMMUNITY): Payer: Self-pay | Admitting: Gastroenterology

## 2014-01-08 ENCOUNTER — Other Ambulatory Visit: Payer: Self-pay | Admitting: Nurse Practitioner

## 2014-01-14 ENCOUNTER — Telehealth: Payer: Self-pay | Admitting: *Deleted

## 2014-01-14 ENCOUNTER — Telehealth: Payer: Self-pay | Admitting: Gastroenterology

## 2014-01-14 NOTE — Telephone Encounter (Signed)
Please call pt. HER stomach Bx mild gastritis.   CONTINUE PROTONIX. TAKE 30 MINUTES PRIOR TO MEALS TWICE DAILY. AVOID ITEMS THAT TRIGGER GASTRITIS. FOLLOW A HIGH FIBER/LOW FAT DIET. AVOID ITEMS THAT CAUSE BLOATING.   FOLLOW UP IN JUL 2015.

## 2014-01-14 NOTE — Telephone Encounter (Signed)
Pt called stating she had a biopsy done 12/25/13 by Dr Darrick PennaFields pt would like to know her results. Please advise [phone] this is pt's work number she said she is at work today.

## 2014-01-14 NOTE — Telephone Encounter (Signed)
Route to [facility] 

## 2014-01-15 NOTE — Telephone Encounter (Signed)
Reminder in EPIC 

## 2014-01-15 NOTE — Telephone Encounter (Signed)
Pt aware of results.  Robin Dixon  [place]

## 2014-01-16 NOTE — Telephone Encounter (Signed)
Reminder in epic °

## 2014-01-20 NOTE — Telephone Encounter (Signed)
REVIEWED.  

## 2014-01-20 NOTE — Telephone Encounter (Signed)
Pt was informed of results on 01/15/2014 by Ginger.

## 2014-02-12 ENCOUNTER — Other Ambulatory Visit: Payer: Self-pay | Admitting: Family Medicine

## 2014-02-14 ENCOUNTER — Other Ambulatory Visit: Payer: Self-pay | Admitting: Nurse Practitioner

## 2014-02-16 NOTE — Telephone Encounter (Signed)
May refill this in 3 additional refills patient will need followup visit later this summer

## 2014-02-25 ENCOUNTER — Telehealth: Payer: Self-pay | Admitting: *Deleted

## 2014-02-25 NOTE — Telephone Encounter (Signed)
Patient voiced understanding.

## 2014-02-25 NOTE — Telephone Encounter (Signed)
I spoke with the patient and explained her tcs was billed as preventative, however she also had a EGD that was probably applied to her deductible.

## 2014-02-25 NOTE — Telephone Encounter (Signed)
I called and lm with the patient's receptionist to let her know that her tcs was coded as a high risk screening.

## 2014-02-25 NOTE — Telephone Encounter (Signed)
Correction: left a message with her receptionist to give me a call.

## 2014-02-25 NOTE — Telephone Encounter (Signed)
Pt called and LMOM yesterday afternoon, pt had a billing question, pt said she had a colonoscopy and a EGD done last month and if it was listed as prevented her insurance will pay 100% of it. Pt wants to talk with someone to find out how it was billed. Please advise pt said she works Monday-thurday 8:00- 5:15 pt said we can call her at work [phone] or her home [phone].

## 2014-03-03 ENCOUNTER — Other Ambulatory Visit: Payer: Self-pay | Admitting: Family Medicine

## 2014-04-16 ENCOUNTER — Encounter: Payer: Self-pay | Admitting: Gastroenterology

## 2014-05-06 ENCOUNTER — Other Ambulatory Visit: Payer: Self-pay | Admitting: Family Medicine

## 2014-05-09 ENCOUNTER — Other Ambulatory Visit: Payer: BC Managed Care – PPO | Admitting: Adult Health

## 2014-06-02 ENCOUNTER — Other Ambulatory Visit: Payer: Self-pay | Admitting: Family Medicine

## 2014-06-10 ENCOUNTER — Telehealth: Payer: Self-pay | Admitting: Family Medicine

## 2014-06-10 ENCOUNTER — Other Ambulatory Visit: Payer: Self-pay | Admitting: *Deleted

## 2014-06-10 MED ORDER — PANTOPRAZOLE SODIUM 40 MG PO TBEC: DELAYED_RELEASE_TABLET | ORAL | Status: DC

## 2014-06-10 NOTE — Telephone Encounter (Signed)
Patient needs Rx for protonix to [facility].  Please call when done.

## 2014-06-10 NOTE — Telephone Encounter (Signed)
Notified patient medication sent to pharmacy 

## 2014-06-14 ENCOUNTER — Other Ambulatory Visit: Payer: Self-pay | Admitting: Adult Health

## 2014-06-14 DIAGNOSIS — Z0000 Encounter for general adult medical examination without abnormal findings: Secondary | ICD-10-CM

## 2014-06-17 ENCOUNTER — Other Ambulatory Visit: Payer: Self-pay | Admitting: Family Medicine

## 2014-06-17 NOTE — Telephone Encounter (Signed)
1 refill, needs office visit before any further refills

## 2014-06-19 ENCOUNTER — Telehealth: Payer: Self-pay | Admitting: General Practice

## 2014-06-19 NOTE — Telephone Encounter (Signed)
Patient called and had questions concerning her bill for her tcs/egd.  I explained to the patient that they applied some of her balance to her deductible from her having an upper endoscopy.  Patient voiced understanding.

## 2014-06-25 ENCOUNTER — Ambulatory Visit (HOSPITAL_COMMUNITY): Payer: BC Managed Care – PPO

## 2014-06-26 ENCOUNTER — Ambulatory Visit (HOSPITAL_COMMUNITY)
Admission: RE | Admit: 2014-06-26 | Discharge: 2014-06-26 | Disposition: A | Discharge status: Home / Self Care | Payer: BC Managed Care – PPO | Source: Ambulatory Visit | Attending: Adult Health | Admitting: Adult Health

## 2014-06-26 DIAGNOSIS — Z1231 Encounter for screening mammogram for malignant neoplasm of breast: Secondary | ICD-10-CM | POA: Diagnosis not present

## 2014-06-26 DIAGNOSIS — Z0000 Encounter for general adult medical examination without abnormal findings: Secondary | ICD-10-CM

## 2014-07-18 ENCOUNTER — Other Ambulatory Visit: Payer: Self-pay | Admitting: Family Medicine

## 2014-07-18 NOTE — Telephone Encounter (Signed)
May refill this +2 additional refills 

## 2014-07-18 NOTE — Telephone Encounter (Signed)
Last seen 11/19/13.

## 2014-07-25 ENCOUNTER — Other Ambulatory Visit: Payer: Self-pay | Admitting: *Deleted

## 2014-07-25 MED ORDER — ESCITALOPRAM OXALATE 20 MG PO TABS: ORAL_TABLET | ORAL | Status: DC

## 2014-08-15 ENCOUNTER — Ambulatory Visit (HOSPITAL_COMMUNITY)
Admission: RE | Admit: 2014-08-15 | Discharge: 2014-08-15 | Disposition: A | Discharge status: Home / Self Care | Payer: BC Managed Care – PPO | Source: Ambulatory Visit | Attending: Family Medicine | Admitting: Family Medicine

## 2014-08-15 ENCOUNTER — Ambulatory Visit (INDEPENDENT_AMBULATORY_CARE_PROVIDER_SITE_OTHER): Payer: BC Managed Care – PPO | Admitting: Family Medicine

## 2014-08-15 ENCOUNTER — Encounter (INDEPENDENT_AMBULATORY_CARE_PROVIDER_SITE_OTHER): Payer: Self-pay

## 2014-08-15 ENCOUNTER — Encounter: Payer: Self-pay | Admitting: Family Medicine

## 2014-08-15 VITALS — BP 122/80 | Ht 67.0 in | Wt 175.2 lb

## 2014-08-15 DIAGNOSIS — IMO0002 Reserved for concepts with insufficient information to code with codable children: Secondary | ICD-10-CM

## 2014-08-15 DIAGNOSIS — L988 Other specified disorders of the skin and subcutaneous tissue: Secondary | ICD-10-CM

## 2014-08-15 DIAGNOSIS — R2232 Localized swelling, mass and lump, left upper limb: Secondary | ICD-10-CM | POA: Insufficient documentation

## 2014-08-15 DIAGNOSIS — I10 Essential (primary) hypertension: Secondary | ICD-10-CM

## 2014-08-15 MED ORDER — ALPRAZOLAM 1 MG PO TABS: 1.0000 mg | ORAL_TABLET | Freq: Two times a day (BID) | ORAL | Status: DC | PRN

## 2014-08-15 MED ORDER — ESCITALOPRAM OXALATE 20 MG PO TABS: ORAL_TABLET | ORAL | Status: DC

## 2014-08-15 MED ORDER — CEFPROZIL 500 MG PO TABS: 500.0000 mg | ORAL_TABLET | Freq: Two times a day (BID) | ORAL | Status: DC

## 2014-08-15 MED ORDER — PANTOPRAZOLE SODIUM 40 MG PO TBEC: DELAYED_RELEASE_TABLET | ORAL | Status: DC

## 2014-08-15 NOTE — Progress Notes (Signed)
   Subjective:    Patient ID: Robin Dixon, female    DOB: 04/09/1966, 48 y.o.   MRN: [id number]  Hypertension This is a chronic problem. The current episode started more than 1 year ago. The problem has been gradually improving since onset. The problem is controlled. Pertinent negatives include no chest pain or shortness of breath. There are no associated agents to hypertension. There are no known risk factors for coronary artery disease. Treatments tried: metoprolol. The current treatment provides significant improvement. There are no compliance problems.    Patient states that she has some sinus issues that has been present for about 2 weeks now.  Also has a knot on her finger that has been there awhile.      Review of Systems  Constitutional: Negative for fever and activity change.  HENT: Positive for congestion and rhinorrhea. Negative for ear pain.   Eyes: Negative for discharge.  Respiratory: Positive for cough. Negative for shortness of breath and wheezing.   Cardiovascular: Negative for chest pain.       Objective:   Physical Exam  Constitutional: She appears well-developed and well-nourished. No distress.  HENT:  Head: Normocephalic.  Nose: Nose normal.  Mouth/Throat: Oropharynx is clear and moist. No oropharyngeal exudate.  Neck: Neck supple.  Cardiovascular: Normal rate, regular rhythm and normal heart sounds.   No murmur heard. Pulmonary/Chest: Effort normal and breath sounds normal. No respiratory distress. She has no wheezes.  Musculoskeletal: She exhibits no edema.  Lymphadenopathy:    She has no cervical adenopathy.  Neurological: She is alert. She exhibits normal muscle tone.  Skin: Skin is warm and dry.  Psychiatric: Her behavior is normal.  Nursing note and vitals reviewed.   Calcium deposit versus cyst on the index finger    Assessment & Plan:  HTN-continue current medication. Check lipid profile Heart healthy diet regular physical  activity Possible cyst versus calcium deposit on the index finger x-ray ordered

## 2014-09-05 ENCOUNTER — Other Ambulatory Visit: Payer: Self-pay | Admitting: *Deleted

## 2014-09-05 MED ORDER — METOPROLOL TARTRATE 50 MG PO TABS: 25.0000 mg | ORAL_TABLET | Freq: Two times a day (BID) | ORAL | Status: DC

## 2014-09-22 ENCOUNTER — Encounter: Payer: Self-pay | Admitting: Family Medicine

## 2014-09-22 LAB — LIPID PANEL
Cholesterol: 229 mg/dL — ABNORMAL HIGH (ref 0–200)
HDL: 72 mg/dL (ref >39)
LDL Cholesterol: 137 mg/dL — ABNORMAL HIGH (ref 0–99)
Total CHOL/HDL Ratio: 3.2 Ratio
Triglycerides: 100 mg/dL (ref <150)
VLDL: 20 mg/dL (ref 0–40)

## 2014-10-24 ENCOUNTER — Ambulatory Visit (INDEPENDENT_AMBULATORY_CARE_PROVIDER_SITE_OTHER): Payer: BC Managed Care – PPO | Admitting: Adult Health

## 2014-10-24 ENCOUNTER — Encounter: Payer: Self-pay | Admitting: Adult Health

## 2014-10-24 VITALS — BP 102/64 | HR 76 | Ht 65.0 in | Wt 171.5 lb

## 2014-10-24 DIAGNOSIS — Z1212 Encounter for screening for malignant neoplasm of rectum: Secondary | ICD-10-CM

## 2014-10-24 DIAGNOSIS — Z01419 Encounter for gynecological examination (general) (routine) without abnormal findings: Secondary | ICD-10-CM

## 2014-10-24 LAB — HEMOCCULT GUIAC POC 1CARD (OFFICE): Fecal Occult Blood, POC: NEGATIVE

## 2014-10-24 NOTE — Patient Instructions (Signed)
Mammogram yearly Pap and physical in 1 year Labs with PCP

## 2014-10-24 NOTE — Progress Notes (Signed)
Patient ID: Robin Dixon, female   DOB: 12/21/1965, 49 y.o.   MRN: [id number] History of Present Illness: Robin Dixon is a 49 year old white female in for well woman gyn exam.She had a normal pap with negative HPV.   Current Medications, Allergies, Past Medical History, Past Surgical History, Family History and Social History were reviewed in [facility] electronic medical record.     Review of Systems: Patient denies any headaches, hearing loss, fatigue, blurred vision, shortness of breath, chest pain, abdominal pain, problems with bowel movements, urination, or intercourse. No joint pain, has pain in neck and sees Dr Gerda DissLuking, had seen chiropractor in past. or mood swings.    Physical Exam:BP 102/64 mmHg  Pulse 76  Ht 5\' 5"  (1.651 m)  Wt 171 lb 8 oz (77.792 kg)  BMI 28.54 kg/m2 General:  Well developed, well nourished, no acute distress Skin:  Warm and dry Neck:  Midline trachea, normal thyroid, good ROM, no lymphadenopathy Lungs; Clear to auscultation bilaterally Breast:  No dominant palpable mass, retraction, or nipple discharge Cardiovascular: Regular rate and rhythm Abdomen:  Soft, non tender, no hepatosplenomegaly Pelvic:  External genitalia is normal in appearance, no lesions.  The vagina is pale with loss of moisture and rugae. Urethra has no lesions or masses. The cervix is smooth.  Uterus is felt to be normal size, shape, and contour.  No adnexal masses or tenderness noted.Bladder is non tender, no masses felt. Rectal: Good sphincter tone, no polyps, or hemorrhoids felt.  Hemoccult negative. 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 Had colonoscopy 12/2103, filled out form for SS.  Impression: Well woman gyn exam no pap    Plan: Labs with PCP Mammogram yearly Pap and physical in 1  year

## 2014-11-27 ENCOUNTER — Ambulatory Visit (INDEPENDENT_AMBULATORY_CARE_PROVIDER_SITE_OTHER): Payer: BC Managed Care – PPO | Admitting: Family Medicine

## 2014-11-27 ENCOUNTER — Encounter: Payer: Self-pay | Admitting: Family Medicine

## 2014-11-27 VITALS — BP 116/68 | Temp 98.6°F | Ht 67.0 in | Wt 177.0 lb

## 2014-11-27 DIAGNOSIS — J329 Chronic sinusitis, unspecified: Secondary | ICD-10-CM

## 2014-11-27 MED ORDER — AZITHROMYCIN 250 MG PO TABS
ORAL_TABLET | ORAL | Status: DC
Start: 2014-11-27 — End: 2014-12-11

## 2014-11-27 MED ORDER — HYDROCODONE-HOMATROPINE 5-1.5 MG/5ML PO SYRP: ORAL_SOLUTION | ORAL | Status: DC

## 2014-11-27 MED ORDER — AZITHROMYCIN 250 MG PO TABS: ORAL_TABLET | ORAL | Status: DC

## 2014-11-27 NOTE — Progress Notes (Signed)
   Subjective:    Patient ID: Robin Dixon, female    DOB: 08/22/1966, 49 y.o.   MRN: [id number]  Cough This is a new problem. Episode onset: 3 days ago. Associated symptoms include ear pain, headaches and a sore throat.   Woke up one morn with a sore throat  Headache , diffuse  Cough off and on  No fever  No energy   Some appetite    Review of Systems  HENT: Positive for ear pain and sore throat.   Respiratory: Positive for cough.   Neurological: Positive for headaches.      no vomiting no diarrhea Objective:   Physical Exam  Alert moderate malaise. Frontal tenderness TMs retracted pharynx normal neck supple. Lungs mild wheeze bronchial cough heart regular in rhythm      Assessment & Plan:  Impression rhinosinusitis/bronchitis with reactive airways plan antibiotics prescribed. Symptom care discussed. Warning signs discussed WSL

## 2014-12-01 ENCOUNTER — Other Ambulatory Visit: Payer: Self-pay | Admitting: Family Medicine

## 2014-12-04 ENCOUNTER — Telehealth: Payer: Self-pay | Admitting: Family Medicine

## 2014-12-04 MED ORDER — PANTOPRAZOLE SODIUM 40 MG PO TBEC: 40.0000 mg | DELAYED_RELEASE_TABLET | Freq: Two times a day (BID) | ORAL | Status: DC

## 2014-12-04 NOTE — Telephone Encounter (Signed)
Patient states that [facility] does not have the pantoprazole (PROTONIX) 40 MG tablet that was sent in.  Can we resend to [facility]?

## 2014-12-04 NOTE — Telephone Encounter (Addendum)
Rx resent electronically to pharmacy. Patient notified. 

## 2014-12-08 ENCOUNTER — Other Ambulatory Visit: Payer: Self-pay | Admitting: Family Medicine

## 2014-12-08 ENCOUNTER — Telehealth: Payer: Self-pay | Admitting: Family Medicine

## 2014-12-08 NOTE — Telephone Encounter (Signed)
Pt's pantoprazole (PROTONIX) 40 MG tablet Rx prior auth is APPROVED from 11/05/14 - 12/07/15 through her [facility], approval [facility] and pt notified

## 2014-12-08 NOTE — Telephone Encounter (Signed)
ERROR

## 2014-12-11 ENCOUNTER — Ambulatory Visit (INDEPENDENT_AMBULATORY_CARE_PROVIDER_SITE_OTHER): Payer: BC Managed Care – PPO | Admitting: Family Medicine

## 2014-12-11 ENCOUNTER — Encounter: Payer: Self-pay | Admitting: Family Medicine

## 2014-12-11 VITALS — BP 112/78 | Ht 67.0 in | Wt 177.0 lb

## 2014-12-11 DIAGNOSIS — K2990 Gastroduodenitis, unspecified, without bleeding: Secondary | ICD-10-CM | POA: Diagnosis not present

## 2014-12-11 DIAGNOSIS — F419 Anxiety disorder, unspecified: Secondary | ICD-10-CM

## 2014-12-11 DIAGNOSIS — I10 Essential (primary) hypertension: Secondary | ICD-10-CM

## 2014-12-11 DIAGNOSIS — K2970 Gastritis, unspecified, without bleeding: Secondary | ICD-10-CM

## 2014-12-11 MED ORDER — METOPROLOL TARTRATE 50 MG PO TABS: 25.0000 mg | ORAL_TABLET | Freq: Two times a day (BID) | ORAL | Status: DC

## 2014-12-11 MED ORDER — AMOXICILLIN-POT CLAVULANATE 875-125 MG PO TABS: 1.0000 | ORAL_TABLET | Freq: Two times a day (BID) | ORAL | Status: DC

## 2014-12-11 MED ORDER — ALPRAZOLAM 1 MG PO TABS: 1.0000 mg | ORAL_TABLET | Freq: Two times a day (BID) | ORAL | Status: DC | PRN

## 2014-12-11 MED ORDER — PANTOPRAZOLE SODIUM 40 MG PO TBEC: 40.0000 mg | DELAYED_RELEASE_TABLET | Freq: Two times a day (BID) | ORAL | Status: DC

## 2014-12-11 NOTE — Progress Notes (Signed)
   Subjective:    Patient ID: Robin Dixon, female    DOB: 10/24/1965, 49 y.o.   MRN: [id number]  Hypertension This is a chronic problem. The current episode started more than 1 year ago. Pertinent negatives include no chest pain or shortness of breath. There are no compliance problems.    Finished zpack. Sinus drainage, sore throat in the am,  headache, and ear pain.  Patient also has issues with anxiety and stress medication helps she denies misusing it She also has troubles with reflux but she takes her medicine twice a day helps  Review of Systems  Constitutional: Negative for fever and activity change.  HENT: Positive for congestion and rhinorrhea. Negative for ear pain.   Eyes: Negative for discharge.  Respiratory: Positive for cough. Negative for shortness of breath and wheezing.   Cardiovascular: Negative for chest pain.       Objective:   Physical Exam  Constitutional: She appears well-developed.  HENT:  Head: Normocephalic.  Nose: Nose normal.  Mouth/Throat: Oropharynx is clear and moist. No oropharyngeal exudate.  Neck: Neck supple.  Cardiovascular: Normal rate and normal heart sounds.   No murmur heard. Pulmonary/Chest: Effort normal and breath sounds normal. She has no wheezes.  Lymphadenopathy:    She has no cervical adenopathy.  Skin: Skin is warm and dry.  Nursing note and vitals reviewed.   Greater than 25 minutes spent on multiple issues patient was talked to at length about multiple problems and questions      Assessment & Plan:  Sinusitis antibiotics prescribed warning signs discussed follow-up if problems Chronic anxiety takes Lexapro does well uses Xanax as well denies overusing it. Does not cause drowsiness Reflux issues has to take Protonix twice daily if she does not she has a lot of problems with burning in the back of her throat she does try to eat properly Blood pressure issues takes medication on regular basis denies any chest tightness  pressure pain shortness of breath continue medications Follow-up in 6 months sooner problems

## 2015-03-02 ENCOUNTER — Other Ambulatory Visit: Payer: Self-pay | Admitting: Family Medicine

## 2015-03-02 NOTE — Telephone Encounter (Signed)
Ok six ref 

## 2015-03-30 ENCOUNTER — Telehealth: Payer: Self-pay | Admitting: Nurse Practitioner

## 2015-03-30 ENCOUNTER — Other Ambulatory Visit: Payer: Self-pay | Admitting: *Deleted

## 2015-03-30 MED ORDER — SUCRALFATE 1 GM/10ML PO SUSP: ORAL | Status: DC

## 2015-03-30 NOTE — Telephone Encounter (Signed)
If the patient once a GI cocktail we would need to send that through [facility] because it would have to be compounded. If they want to stick with [facility] I would recommend Carafate 1 tablespoon 4 times a day when necessary, 8 ounces call the patient see what she would like to do. Continue the PPI

## 2015-03-30 NOTE — Telephone Encounter (Signed)
Discussed with pt. Pt prefers carafate. [facility]

## 2015-03-30 NOTE — Telephone Encounter (Signed)
Pt calling to see if she can get a bottle of GI cocktail called in States she has had reflux so bad it's made her voice harsh   [facility]

## 2015-03-30 NOTE — Telephone Encounter (Signed)
Had bad reflux yesterday and finished all her gi cocktail. Takes protonix  bid. Throat today is burning and feels raw.

## 2015-06-11 ENCOUNTER — Ambulatory Visit (INDEPENDENT_AMBULATORY_CARE_PROVIDER_SITE_OTHER): Payer: BC Managed Care – PPO | Admitting: Family Medicine

## 2015-06-11 ENCOUNTER — Encounter: Payer: Self-pay | Admitting: Family Medicine

## 2015-06-11 VITALS — BP 120/84 | Ht 67.0 in | Wt 184.0 lb

## 2015-06-11 DIAGNOSIS — K2970 Gastritis, unspecified, without bleeding: Secondary | ICD-10-CM | POA: Diagnosis not present

## 2015-06-11 DIAGNOSIS — K2990 Gastroduodenitis, unspecified, without bleeding: Secondary | ICD-10-CM

## 2015-06-11 DIAGNOSIS — F419 Anxiety disorder, unspecified: Secondary | ICD-10-CM | POA: Diagnosis not present

## 2015-06-11 DIAGNOSIS — M792 Neuralgia and neuritis, unspecified: Secondary | ICD-10-CM

## 2015-06-11 DIAGNOSIS — Z23 Encounter for immunization: Secondary | ICD-10-CM | POA: Diagnosis not present

## 2015-06-11 DIAGNOSIS — I10 Essential (primary) hypertension: Secondary | ICD-10-CM | POA: Diagnosis not present

## 2015-06-11 DIAGNOSIS — M5412 Radiculopathy, cervical region: Secondary | ICD-10-CM

## 2015-06-11 MED ORDER — NAPROXEN 500 MG PO TABS: 500.0000 mg | ORAL_TABLET | Freq: Two times a day (BID) | ORAL | Status: DC | PRN

## 2015-06-11 MED ORDER — PANTOPRAZOLE SODIUM 40 MG PO TBEC: 40.0000 mg | DELAYED_RELEASE_TABLET | Freq: Two times a day (BID) | ORAL | Status: DC

## 2015-06-11 MED ORDER — METOPROLOL TARTRATE 50 MG PO TABS: 25.0000 mg | ORAL_TABLET | Freq: Two times a day (BID) | ORAL | Status: DC

## 2015-06-11 MED ORDER — ESCITALOPRAM OXALATE 20 MG PO TABS: 20.0000 mg | ORAL_TABLET | Freq: Every day | ORAL | Status: DC

## 2015-06-11 MED ORDER — ALPRAZOLAM 1 MG PO TABS: 1.0000 mg | ORAL_TABLET | Freq: Two times a day (BID) | ORAL | Status: DC | PRN

## 2015-06-11 NOTE — Progress Notes (Signed)
   Subjective:    Patient ID: Robin Dixon, female    DOB: [birth year], 49 y.o.   MRN: [id number]  Hypertension This is a chronic problem. The current episode started more than 1 year ago. The problem has been gradually improving since onset. Pertinent negatives include no chest pain. There are no associated agents to hypertension. There are no known risk factors for coronary artery disease. Treatments tried: metoprolol. The current treatment provides moderate improvement. There are no compliance problems.     Patient is having right hand numbness and pain. This started about several weeks ago. Treatments tried: Naproxen. Patient states it's worse at nighttime she wakes up with it. She denies any injury. Denies any arm numbness. Has had problems with her neck in the past but not severe.  Moods are doing good overall  She relates no gastritis symptoms but when she tries to back down over time she has significant trouble with reflux She states she does try to watch her diet and tries exercise                Review of Systems  Constitutional: Negative for activity change, appetite change and fatigue.  HENT: Negative for congestion.   Respiratory: Negative for cough.   Cardiovascular: Negative for chest pain.  Gastrointestinal: Negative for abdominal pain.  Endocrine: Negative for polydipsia and polyphagia.  Neurological: Negative for weakness.  Psychiatric/Behavioral: Negative for confusion.       Objective:   Physical Exam  Constitutional: She appears well-nourished. No distress.  Cardiovascular: Normal rate, regular rhythm and normal heart sounds.   No murmur heard. Pulmonary/Chest: Effort normal and breath sounds normal. No respiratory distress.  Musculoskeletal: She exhibits no edema.  Lymphadenopathy:    She has no cervical adenopathy.  Neurological: She is alert. She exhibits normal muscle tone.  Psychiatric: Her behavior is normal.  Vitals reviewed.         Assessment & Plan:  1. Encounter for immunization Flu vaccine today  2. Essential hypertension Blood pressure good control check metabolic 7 continue medication - Basic metabolic panel  3. Arm neuralgia Numbness in the hand probable carpal tunnel needs nerve conduction study. - Ambulatory referral to Neurology  4. Gastritis and gastroduodenitis Continue medication when she tried to cut down on it she still has significant gastritis  5. Anxiety Prescription for Xanax renewed.  If all goes well follow-up 6 months

## 2015-06-12 ENCOUNTER — Other Ambulatory Visit: Payer: Self-pay | Admitting: Family Medicine

## 2015-06-12 NOTE — Telephone Encounter (Signed)
May have this +5 refills 

## 2015-06-13 LAB — BASIC METABOLIC PANEL
BUN/Creatinine Ratio: 22 (ref 9–23)
BUN: 18 mg/dL (ref 6–24)
CALCIUM: 9.8 mg/dL (ref 8.7–10.2)
CO2: 24 mmol/L (ref 18–29)
CREATININE: 0.81 mg/dL (ref 0.57–1.00)
Chloride: 99 mmol/L (ref 97–108)
GFR, EST AFRICAN AMERICAN: 97 mL/min/{1.73_m2} (ref >59)
GFR, EST NON AFRICAN AMERICAN: 84 mL/min/{1.73_m2} (ref >59)
Glucose: 86 mg/dL (ref 65–99)
Potassium: 4.4 mmol/L (ref 3.5–5.2)
Sodium: 139 mmol/L (ref 134–144)

## 2015-06-14 ENCOUNTER — Encounter: Payer: Self-pay | Admitting: Family Medicine

## 2015-07-02 ENCOUNTER — Ambulatory Visit (INDEPENDENT_AMBULATORY_CARE_PROVIDER_SITE_OTHER): Payer: BC Managed Care – PPO | Admitting: Neurology

## 2015-07-02 ENCOUNTER — Encounter: Payer: Self-pay | Admitting: Neurology

## 2015-07-02 ENCOUNTER — Ambulatory Visit (INDEPENDENT_AMBULATORY_CARE_PROVIDER_SITE_OTHER): Payer: Self-pay | Admitting: Neurology

## 2015-07-02 DIAGNOSIS — G5601 Carpal tunnel syndrome, right upper limb: Secondary | ICD-10-CM | POA: Diagnosis not present

## 2015-07-02 DIAGNOSIS — G5600 Carpal tunnel syndrome, unspecified upper limb: Secondary | ICD-10-CM

## 2015-07-02 HISTORY — DX: Carpal tunnel syndrome, unspecified upper limb: G56.00

## 2015-07-02 NOTE — Progress Notes (Signed)
Please refer to EMG and nerve conduction study procedure note. 

## 2015-07-02 NOTE — Procedures (Signed)
     HISTORY:  Robin Dixon is a 49 year old patient with a one-month history of numbness and discomfort in the right wrist that will wake her up at night. The patient has had some transient neck pain and stiffness that has improved at this point. The patient is sent in for evaluation for a possible neuropathy or a cervical radiculopathy.  NERVE CONDUCTION STUDIES:  Nerve conduction studies were performed on both upper extremities. The distal motor latencies for the median nerves were prolonged on the right, normal on the left, with normal motor amplitudes for these nerves bilaterally. The distal motor latencies and motor amplitudes for the ulnar nerves were normal bilaterally. The F wave latencies and nerve conduction velocities for the median and ulnar nerves were normal bilaterally. The sensory latencies for the median nerves were prolonged on the right, normal on the left, and normal for the ulnar nerves bilaterally.  EMG STUDIES:  EMG study was performed on the right upper extremity:  The first dorsal interosseous muscle reveals 2 to 4 K units with full recruitment. No fibrillations or positive waves were noted. The abductor pollicis brevis muscle reveals 2 to 4 K units with full recruitment. No fibrillations or positive waves were noted. The extensor indicis proprius muscle reveals 1 to 3 K units with full recruitment. No fibrillations or positive waves were noted. The pronator teres muscle reveals 2 to 3 K units with full recruitment. No fibrillations or positive waves were noted. The biceps muscle reveals 1 to 2 K units with full recruitment. No fibrillations or positive waves were noted. The triceps muscle reveals 2 to 4 K units with full recruitment. No fibrillations or positive waves were noted. The anterior deltoid muscle reveals 2 to 3 K units with full recruitment. No fibrillations or positive waves were noted. The cervical paraspinal muscles were tested at 2 levels. No  abnormalities of insertional activity were seen at either level tested. There was good relaxation.   IMPRESSION:  Nerve conduction studies done on both upper extremities shows evidence of a mild right carpal tunnel syndrome. No abnormalities were seen on the left. EMG evaluation of the right upper extremity was unremarkable, without evidence of an overlying cervical radiculopathy.  Marlan Palau. Keith Laiden Milles MD 07/02/2015 1:21 PM  [facility] [address] [place], [place] [id number]  Phone [phone] Fax [phone]

## 2015-07-03 ENCOUNTER — Other Ambulatory Visit: Payer: Self-pay | Admitting: Family Medicine

## 2015-07-03 DIAGNOSIS — Z1231 Encounter for screening mammogram for malignant neoplasm of breast: Secondary | ICD-10-CM

## 2015-07-09 ENCOUNTER — Telehealth: Payer: Self-pay | Admitting: Family Medicine

## 2015-07-09 DIAGNOSIS — G5601 Carpal tunnel syndrome, right upper limb: Secondary | ICD-10-CM

## 2015-07-09 NOTE — Telephone Encounter (Signed)
Nurse's-please let the patient know her nerve conduction study does show mild carpal tunnel on the right side the next step would be seen orthopedics. There are several good orthopedist who could handle this. Dr. Romeo AppleHarrison locally. Several orthopedic doctors who come from [place] also several good hand specialist in [place]. See patient has a preference please

## 2015-07-10 NOTE — Telephone Encounter (Signed)
[facility] 07/10/15 

## 2015-07-13 NOTE — Telephone Encounter (Signed)
Results discussed with patient. Patient advised that know her nerve conduction study does show mild carpal tunnel on the right side the next step would be seen orthopedics. There are several good orthopedist who could handle this. Dr. Romeo AppleHarrison locally. Several orthopedic doctors who come from [place] also several good hand specialist in [place]. Patient verbalized understanding and would like to see Dr Romeo AppleHarrison. Referral ordered in EPIC.

## 2015-07-13 NOTE — Addendum Note (Signed)
Addended by: Margaretha SheffieldBROWN, Liberta Gimpel S on: 07/13/2015 08:28 AM   Modules accepted: Orders

## 2015-07-20 ENCOUNTER — Ambulatory Visit (HOSPITAL_COMMUNITY): Payer: Self-pay

## 2015-07-23 ENCOUNTER — Ambulatory Visit (INDEPENDENT_AMBULATORY_CARE_PROVIDER_SITE_OTHER): Payer: BC Managed Care – PPO | Admitting: Orthopedic Surgery

## 2015-07-23 ENCOUNTER — Encounter: Payer: Self-pay | Admitting: Orthopedic Surgery

## 2015-07-23 ENCOUNTER — Ambulatory Visit (INDEPENDENT_AMBULATORY_CARE_PROVIDER_SITE_OTHER): Payer: BC Managed Care – PPO | Admitting: Family Medicine

## 2015-07-23 ENCOUNTER — Encounter: Payer: Self-pay | Admitting: Family Medicine

## 2015-07-23 VITALS — BP 125/76 | Ht 67.0 in | Wt 184.0 lb

## 2015-07-23 VITALS — BP 112/76 | Temp 97.9°F | Ht 67.0 in | Wt 186.0 lb

## 2015-07-23 DIAGNOSIS — J329 Chronic sinusitis, unspecified: Secondary | ICD-10-CM

## 2015-07-23 DIAGNOSIS — G5601 Carpal tunnel syndrome, right upper limb: Secondary | ICD-10-CM | POA: Diagnosis not present

## 2015-07-23 MED ORDER — AMOXICILLIN 500 MG PO CAPS: 500.0000 mg | ORAL_CAPSULE | Freq: Three times a day (TID) | ORAL | Status: DC

## 2015-07-23 MED ORDER — HYDROCODONE-HOMATROPINE 5-1.5 MG/5ML PO SYRP: 5.0000 mL | ORAL_SOLUTION | Freq: Every evening | ORAL | Status: DC | PRN

## 2015-07-23 MED ORDER — GABAPENTIN 100 MG PO CAPS: 100.0000 mg | ORAL_CAPSULE | Freq: Three times a day (TID) | ORAL | Status: DC

## 2015-07-23 NOTE — Progress Notes (Addendum)
Patient ID: Robin Dixon, female   DOB: 09-14-1966, 49 y.o.   MRN: [id number]  Chief Complaint  Patient presents with  . Carpal Tunnel    CTS of right hand, referred by Dr. Lilyan Punt.    HPI Robin Dixon is a 49 y.o. female.  The patient is here today to evaluate her for right carpal tunnel syndrome. She has a couple month history of pain with numbness and tingling and some swelling in her right hand and wrist. Sometimes it feels swollen and she complains of a dull ache in the median nerve distribution. Symptoms currently are more in the morning and in the evenings and the pain is rated 7 out of 10 but she has not had any treatment she did have a nerve conduction study from [facility] neurology and from what I can see it appears that her sensory latency is longer on the right but the official interpretation not currently available    Review of Systems Review of Systems  Constitutional: Negative for fever, diaphoresis, fatigue and unexpected weight change.  HENT: Positive for postnasal drip.   Respiratory: Positive for cough.   Cardiovascular: Positive for palpitations.  Genitourinary: Positive for hematuria.  Neurological: Positive for numbness.  Psychiatric/Behavioral: The patient is nervous/anxious.     Past Medical History  Diagnosis Date  . Hypertension   . Anxiety 03/29/2013  . Hot flashes 03/29/2013  . PMB (postmenopausal bleeding) 06/12/2013    US WNL  . History of neck injury   . Mental disorder     anxiety depression  . Carpal tunnel syndrome 07/02/2015    Right    Past Surgical History  Procedure Laterality Date  . Cholecystectomy      [place]  . Colonoscopy N/A 12/27/2013    Procedure: COLONOSCOPY;  Surgeon: West Bali, MD;  Location: [facility];  Service: Endoscopy;  Laterality: N/A;  1:45  . Esophagogastroduodenoscopy N/A 12/27/2013    Procedure: ESOPHAGOGASTRODUODENOSCOPY (EGD);  Surgeon: West Bali, MD;  Location: [facility];   Service: Endoscopy;  Laterality: N/A;    Family History  Problem Relation Age of Onset  . Diabetes Mother   . Hypertension Mother   . Stroke Mother   . Cancer Mother     blood  . COPD Sister   . Hypertension Sister   . Cancer Brother     melanoma  . Colon cancer Neg Hx   . Colon polyps Neg Hx   . Hypertension Sister     Social History Social History  Substance Use Topics  . Smoking status: Former Smoker    Types: Cigarettes    Quit date: 02/22/1998  . Smokeless tobacco: Never Used     Comment: quit years ago  . Alcohol Use: Yes     Comment: occ    Allergies  Allergen Reactions  . Cefzil [Cefprozil] Nausea Only    Current Outpatient Prescriptions  Medication Sig Dispense Refill  . ALPRAZolam (XANAX) 1 MG tablet TAKE ONE TABLET BY MOUTH TWICE DAILY AS NEEDED 60 tablet 5  . escitalopram (LEXAPRO) 20 MG tablet Take 1 tablet (20 mg total) by mouth daily. 90 tablet 3  . gabapentin (NEURONTIN) 100 MG capsule Take 1 capsule (100 mg total) by mouth 3 (three) times daily. 90 capsule 1  . metoprolol (LOPRESSOR) 50 MG tablet Take 0.5 tablets (25 mg total) by mouth 2 (two) times daily. 90 tablet 3  . naproxen (NAPROSYN) 500 MG tablet Take 1 tablet (500 mg total)  by mouth 2 (two) times daily as needed. 180 tablet 3  . pantoprazole (PROTONIX) 40 MG tablet Take 1 tablet (40 mg total) by mouth 2 (two) times daily. 180 tablet 3   No current facility-administered medications for this visit.       Physical Exam Physical Exam Blood pressure 125/76, height 5\' 7"  (1.702 m), weight 184 lb (83.462 kg). Appearance, there are no abnormalities in terms of appearance the patient was well-developed and well-nourished. The grooming and hygiene were normal.  Mental status orientation, there was normal alertness and orientation Mood pleasant Ambulatory status normal with no assistive devices  Examination of the right and left hand comparison. There is no swelling color and temperature are  normal for range of motion no atrophy. Wrist stability were normal. Motor strength was normal as well. Skin warm dry and intact. Neurologic exam showed abnormal sensation in the median nerve distribution on the right sharp pinprick testing and a positive nerve compression test. Pulse is otherwise normal capillary refill excellent both hands  Data Reviewed Nerve study worksheet was noted to have a long or prolonged latency on the right median nerve sensory test I had to interpreted as there was no corresponding report  Assessment  Carpal tunnel syndrome right not treated medically   Plan  Already on naproxen and gabapentin 100 mg 3 times a day and splenic carpal tunnel for 6 weeks. Come back if no improvement will probably recommend surgical intervention

## 2015-07-23 NOTE — Progress Notes (Signed)
   Subjective:    Patient ID: Robin Dixon, female    DOB: 03/04/1966, 49 y.o.   MRN: [id number]  Sinusitis This is a new problem. The current episode started 1 to 4 weeks ago. The problem is unchanged. There has been no fever. The pain is moderate. Associated symptoms include congestion, coughing, ear pain and headaches. (Nausea) Past treatments include oral decongestants. The treatment provided no relief.   Patient has a knot on her index finger on the left hand. She would like the doctor to take a look at it.   strted as sore throat headace  And dim energy and aching   otc meds not   Gets into the chest an trouble coughing  Review of Systems  HENT: Positive for congestion and ear pain.   Respiratory: Positive for cough.   Neurological: Positive for headaches.       Objective:   Physical Exam  Alert moderate malaise vital stable HET moderate his congestion frontal tenderness pharynx: Neck supple lungs bronchial cough no true wheezes heart rare rhythm small palpable tingly on cyst on left index finger      Assessment & Plan:  Impression 1 rhinosinusitis #2 ganglion ounces plan antibiotics prescribed. Symptom care discussed. Warning signs as far as finger discussed WSL

## 2015-07-23 NOTE — Patient Instructions (Signed)
Brace x 6 weeks  New medication sent to your pharmacy

## 2015-07-23 NOTE — Addendum Note (Signed)
Addended by: Adella HareBOOTHE, Jeanie Mccard B on: 07/23/2015 04:27 PM   Modules accepted: Medications

## 2015-07-27 ENCOUNTER — Ambulatory Visit (HOSPITAL_COMMUNITY)
Admission: RE | Admit: 2015-07-27 | Discharge: 2015-07-27 | Disposition: A | Discharge status: Home / Self Care | Payer: BC Managed Care – PPO | Source: Ambulatory Visit | Attending: Family Medicine | Admitting: Family Medicine

## 2015-07-27 DIAGNOSIS — Z1231 Encounter for screening mammogram for malignant neoplasm of breast: Secondary | ICD-10-CM | POA: Diagnosis present

## 2015-08-09 ENCOUNTER — Emergency Department (HOSPITAL_COMMUNITY)
Admission: EM | Admit: 2015-08-09 | Discharge: 2015-08-09 | Disposition: A | Discharge status: Home / Self Care | Payer: BC Managed Care – PPO | Attending: Emergency Medicine | Admitting: Emergency Medicine

## 2015-08-09 ENCOUNTER — Encounter (HOSPITAL_COMMUNITY): Payer: Self-pay | Admitting: *Deleted

## 2015-08-09 DIAGNOSIS — R51 Headache: Secondary | ICD-10-CM | POA: Diagnosis not present

## 2015-08-09 DIAGNOSIS — Z8669 Personal history of other diseases of the nervous system and sense organs: Secondary | ICD-10-CM | POA: Insufficient documentation

## 2015-08-09 DIAGNOSIS — R1110 Vomiting, unspecified: Secondary | ICD-10-CM | POA: Diagnosis not present

## 2015-08-09 DIAGNOSIS — Z87891 Personal history of nicotine dependence: Secondary | ICD-10-CM | POA: Diagnosis not present

## 2015-08-09 DIAGNOSIS — R52 Pain, unspecified: Secondary | ICD-10-CM | POA: Diagnosis present

## 2015-08-09 DIAGNOSIS — I10 Essential (primary) hypertension: Secondary | ICD-10-CM | POA: Insufficient documentation

## 2015-08-09 DIAGNOSIS — F419 Anxiety disorder, unspecified: Secondary | ICD-10-CM | POA: Diagnosis not present

## 2015-08-09 DIAGNOSIS — J020 Streptococcal pharyngitis: Secondary | ICD-10-CM | POA: Diagnosis not present

## 2015-08-09 DIAGNOSIS — Z8742 Personal history of other diseases of the female genital tract: Secondary | ICD-10-CM | POA: Insufficient documentation

## 2015-08-09 DIAGNOSIS — Z792 Long term (current) use of antibiotics: Secondary | ICD-10-CM | POA: Diagnosis not present

## 2015-08-09 DIAGNOSIS — H9209 Otalgia, unspecified ear: Secondary | ICD-10-CM | POA: Insufficient documentation

## 2015-08-09 DIAGNOSIS — Z79899 Other long term (current) drug therapy: Secondary | ICD-10-CM | POA: Diagnosis not present

## 2015-08-09 LAB — RAPID STREP SCREEN (MED CTR MEBANE ONLY): Streptococcus, Group A Screen (Direct): POSITIVE — AB

## 2015-08-09 MED ORDER — OXYCODONE-ACETAMINOPHEN 5-325 MG PO TABS
2.0000 | ORAL_TABLET | Freq: Once | ORAL | Status: AC
  Administered 2015-08-09: 2 via ORAL
  Filled 2015-08-09: qty 2

## 2015-08-09 MED ORDER — PENICILLIN G BENZATHINE 1200000 UNIT/2ML IM SUSP
1.2000 10*6.[IU] | Freq: Once | INTRAMUSCULAR | Status: AC
  Administered 2015-08-09: 1.2 10*6.[IU] via INTRAMUSCULAR
  Filled 2015-08-09: qty 2

## 2015-08-09 MED ORDER — ONDANSETRON 4 MG PO TBDP
4.0000 mg | ORAL_TABLET | Freq: Once | ORAL | Status: AC
  Administered 2015-08-09: 4 mg via ORAL
  Filled 2015-08-09: qty 1

## 2015-08-09 MED ORDER — IBUPROFEN 400 MG PO TABS
600.0000 mg | ORAL_TABLET | Freq: Once | ORAL | Status: AC
  Administered 2015-08-09: 600 mg via ORAL
  Filled 2015-08-09: qty 2

## 2015-08-09 MED ORDER — TRAMADOL HCL 50 MG PO TABS: 50.0000 mg | ORAL_TABLET | Freq: Four times a day (QID) | ORAL | Status: DC | PRN

## 2015-08-09 MED ORDER — ONDANSETRON HCL 4 MG PO TABS: 4.0000 mg | ORAL_TABLET | Freq: Four times a day (QID) | ORAL | Status: DC

## 2015-08-09 NOTE — Discharge Instructions (Signed)

## 2015-08-09 NOTE — ED Provider Notes (Signed)
CSN: [id number]     Arrival date & time 08/09/15  4098 History  By signing my name below, I, Lyndel Safe, attest that this documentation has been prepared under the direction and in the presence of Raeford Razor, MD. Electronically Signed: Lyndel Safe, ED Scribe. 08/09/2015. 10:08 AM.   Chief Complaint  Patient presents with  . Generalized Body Aches  . Emesis    The history is provided by the patient. No language interpreter was used.   HPI Comments: Robin Dixon is a 49 y.o. female, with a PMhx of HTN, who presents to the Emergency Department complaining of a sore throat onset 2 days ago with the following associated symptoms of generalized myalgias, subjective fevers and chills, bilateral otalgia, a headache and vomiting onset after sore throat. Pt has been unable to take any alleviating medication due to emesis following intake. He notes a positive sick contact of younger family member who has strep throat. He has received his flu shot this season.  The pt admits he was seen by his PCP 3 weeks ago when he was prescribed an amoxicillin treatment, which he was complaint with, for a sinus infection. Denies coughing, CP, and diarrhea.   Past Medical History  Diagnosis Date  . Hypertension   . Anxiety 03/29/2013  . Hot flashes 03/29/2013  . PMB (postmenopausal bleeding) 06/12/2013    US WNL  . History of neck injury   . Mental disorder     anxiety depression  . Carpal tunnel syndrome 07/02/2015    Right   Past Surgical History  Procedure Laterality Date  . Cholecystectomy      ROXBORO-GALLSTONES  . Colonoscopy N/A 12/27/2013    Procedure: COLONOSCOPY;  Surgeon: West Bali, MD;  [facility];  Service: Endoscopy;  Laterality: N/A;  1:45  . Esophagogastroduodenoscopy N/A 12/27/2013    Procedure: ESOPHAGOGASTRODUODENOSCOPY (EGD);  Surgeon: West Bali, MD;  Location[facility];  Service: Endoscopy;  Laterality: N/A;   Family History  Problem Relation Age  of Onset  . Diabetes Mother   . Hypertension Mother   . Stroke Mother   . Cancer Mother     blood  . COPD Sister   . Hypertension Sister   . Cancer Brother     melanoma  . Colon cancer Neg Hx   . Colon polyps Neg Hx   . Hypertension Sister    Social History  Substance Use Topics  . Smoking status: Former Smoker    Types: Cigarettes    Quit date: 02/22/1998  . Smokeless tobacco: Never Used     Comment: quit years ago  . Alcohol Use: Yes     Comment: occ   OB History    Gravida Para Term Preterm AB TAB SAB Ectopic Multiple Living       Review of Systems  Constitutional: Positive for fever and chills.  HENT: Positive for ear pain and sore throat.   Respiratory: Negative for cough.   Cardiovascular: Negative for chest pain.  Gastrointestinal: Positive for vomiting. Negative for diarrhea.  Musculoskeletal: Positive for myalgias ( generalized ).  Neurological: Positive for headaches.  All other systems reviewed and are negative.  Allergies  Cefzil  Home Medications   Prior to Admission medications   Medication Sig Start Date End Date Taking? Authorizing Provider  ALPRAZolam Prudy Feeler) 1 MG tablet TAKE ONE TABLET BY MOUTH TWICE DAILY AS NEEDED 06/12/15   Lorin Picket A  Gerda DissLuking, MD  amoxicillin (AMOXIL) 500 MG capsule Take 1 capsule (500 mg total) by mouth 3 (three) times daily. 07/23/15   Merlyn AlbertWilliam S Luking, MD  escitalopram (LEXAPRO) 20 MG tablet Take 1 tablet (20 mg total) by mouth daily. 06/11/15   Babs SciaraScott A Luking, MD  gabapentin (NEURONTIN) 100 MG capsule Take 1 capsule (100 mg total) by mouth 3 (three) times daily. 07/23/15   Vickki HearingStanley E Harrison, MD  HYDROcodone-homatropine [facility](HYCODAN) 5-1.5 MG/5ML syrup Take 5 mLs by mouth at bedtime as needed for cough. 07/23/15   Merlyn AlbertWilliam S Luking, MD  metoprolol (LOPRESSOR) 50 MG tablet Take 0.5 tablets (25 mg total) by mouth 2 (two) times daily. 06/11/15   Babs SciaraScott A Luking, MD  naproxen (NAPROSYN) 500 MG tablet Take 1 tablet (500 mg  total) by mouth 2 (two) times daily as needed. 06/11/15   Babs SciaraScott A Luking, MD  pantoprazole (PROTONIX) 40 MG tablet Take 1 tablet (40 mg total) by mouth 2 (two) times daily. 06/11/15   Babs SciaraScott A Luking, MD   Pulse 103  Temp(Src) 100.4 F (38 C) (Oral)  Resp 18  Ht 5\' 6"  (1.676 m)  Wt 180 lb (81.647 kg)  BMI 29.07 kg/m2  SpO2 100% Physical Exam  Constitutional: She is oriented to person, place, and time. She appears well-developed and well-nourished. No distress.  HENT:  Head: Normocephalic and atraumatic.  Mouth/Throat: Uvula is midline. Posterior oropharyngeal erythema present. No oropharyngeal exudate.  Non exudative pharyngitis; scaring of left TM, no signs of acute infection in either ear.   Eyes: EOM are normal.  Neck: Normal range of motion.  Cardiovascular: Normal rate, regular rhythm and normal heart sounds.   Pulmonary/Chest: Effort normal and breath sounds normal.  Abdominal: Soft. She exhibits no distension. There is no tenderness.  Musculoskeletal: Normal range of motion.  Neurological: She is alert and oriented to person, place, and time.  Skin: Skin is warm and dry.  Psychiatric: She has a normal mood and affect. Judgment normal.  Nursing note and vitals reviewed.   ED Course  Procedures  DIAGNOSTIC STUDIES: Oxygen Saturation is 100% on RA, normal by my interpretation.    COORDINATION OF CARE: 10:08 AM Discussed treatment plan with pt at bedside and pt agreed to plan. Will order rapid strep screening.   Labs Review Labs Reviewed  RAPID STREP SCREEN (NOT AT [facility]) - Abnormal; Notable for the following:    Streptococcus, Group A Screen (Direct) POSITIVE (*)    All other components within normal limits   I have personally reviewed and evaluated these lab results as part of my medical decision-making.   MDM   Final diagnoses:  Strep pharyngitis    49yf with pharyngitis. No evidence of airway compromise/serious deep space infection. Bicillin. Symptomatic tx. It  has been determined that no acute conditions requiring further emergency intervention are present at this time. The patient has been advised of the diagnosis and plan. I reviewed any labs and imaging including any potential incidental findings. We have discussed signs and symptoms that warrant return to the ED and they are listed in the discharge instructions.    I personally preformed the services scribed in my presence. The recorded information has been reviewed is accurate. Raeford RazorStephen Neilson Oehlert, MD.    Raeford RazorStephen Macie Baum, MD 08/25/15 [phone]

## 2015-08-09 NOTE — ED Notes (Signed)
Flu like illness. Pt states body aches, sore throat, chills, headache, and vomiting. Pt states younger family member has strep throat as well.

## 2015-09-02 ENCOUNTER — Ambulatory Visit (INDEPENDENT_AMBULATORY_CARE_PROVIDER_SITE_OTHER): Payer: BC Managed Care – PPO | Admitting: Orthopedic Surgery

## 2015-09-02 VITALS — BP 142/76 | Ht 66.0 in | Wt 180.0 lb

## 2015-09-02 DIAGNOSIS — G5601 Carpal tunnel syndrome, right upper limb: Secondary | ICD-10-CM | POA: Diagnosis not present

## 2015-09-02 NOTE — Progress Notes (Signed)
This is a follow-up visit to reevaluate carpal tunnel syndrome right hand   Chief Complaint  Patient presents with  . Follow-up    6 week follow up Rt carpal tunnel   HPI Robin Dixon is a 49 y.o. female. The patient is here today to evaluate her for right carpal tunnel syndrome. She has a couple month history of pain with numbness and tingling and some swelling in her right hand and wrist. Sometimes it feels swollen and she complains of a dull ache in the median nerve distribution. Symptoms currently are more in the morning and in the evenings and the pain is rated 7 out of 10 but she has not had any treatment she did have a [facility] conduction [facility] and from what I can see it appears that her sensory latency is longer on the right but the official interpretation not currently available  She reports that her symptoms have not improved  Review of systems noted on 07/23/2015 have not changed she has postnasal drip cough and palpitations hematuria numbness and nervousness and anxiety  We treated her with gabapentin, naproxen carpal tunnel splinting and she has not improved  We discussed continued medical management versus surgical intervention. #1 complication being sensitivity of the scar. Chance of surgical relief 95% chance of 95% relief symptoms  Patient is opted for surgical intervention.  We will schedule her for surgery at her earliest convenience.  Encounter Diagnosis  Name Primary?  . Carpal tunnel syndrome of right wrist Yes     Time 15 min

## 2015-09-02 NOTE — Addendum Note (Signed)
Addended by: Adella HareBOOTHE, JAIME B on: 09/02/2015 04:15 PM   Modules accepted: Orders

## 2015-09-02 NOTE — Patient Instructions (Signed)
Surgery Right carpal tunnel release- 09/23/15  Carpal Tunnel Release Carpal tunnel release is a surgical procedure to relieve numbness and pain in your hand that are caused by carpal tunnel syndrome. Your carpal tunnel is a narrow, hollow space in your wrist. It passes between your wrist bones and a band of connective tissue (transverse carpal ligament). The nerve that supplies most of your hand (median nerve) passes through this space, and so do the connections between your fingers and the muscles of your arm (tendons). Carpal tunnel syndrome makes this space swell and become narrow, and this causes pain and numbness. In carpal tunnel release surgery, a surgeon cuts through the transverse carpal ligament to make more room in the carpal tunnel space. You may have this surgery if other types of treatment have not worked. LET [facility] PROVIDER KNOW ABOUT:  Any allergies you have.  All medicines you are taking, including vitamins, herbs, eye drops, creams, and over-the-counter medicines.  Previous problems you or members of your family have had with the use of anesthetics.  Any blood disorders you have.  Previous surgeries you have had.  Medical conditions you have. RISKS AND COMPLICATIONS Generally, this is a safe procedure. However, problems may occur, including:  Bleeding.  Infection.  Injury to the median nerve.  Need for additional surgery. BEFORE THE PROCEDURE  Ask your health care provider about:  Changing or stopping your regular medicines. This is especially important if you are taking diabetes medicines or blood thinners.  Taking medicines such as aspirin and ibuprofen. These medicines can thin your blood. Do not take these medicines before your procedure if your health care provider instructs you not to.  Do not eat or drink anything after midnight on the night before the procedure or as directed by your health care provider.  Plan to have someone take you home  after the procedure. PROCEDURE  An IV tube may be inserted into a vein.  You will be given one of the following:  A medicine that numbs the wrist area (local anesthetic). You may also be given a medicine to make you relax (sedative).  A medicine that makes you go to sleep (general anesthetic).  Your arm, hand, and wrist will be cleaned with a germ-killing solution (antiseptic).  Your surgeon will make a surgical cut (incision) over the palm side of your wrist. The surgeon will pull aside the skin of your wrist to expose the carpal tunnel space.  The surgeon will cut the transverse carpal ligament.  The edges of the incision will be closed with stitches (sutures) or staples.  A bandage (dressing) will be placed over your wrist and wrapped around your hand and wrist. AFTER THE PROCEDURE  You may spend some time in a recovery area.  Your blood pressure, heart rate, breathing rate, and blood oxygen level will be monitored often until the medicines you were given have worn off.  You will likely have some pain. You will be given pain medicine.  You may need to wear a splint or a wrist brace over your dressing.   This information is not intended to replace advice given to you by your health care provider. Make sure you discuss any questions you have with your health care provider.   Document Released: 11/26/2003 Document Revised: 09/26/2014 Document Reviewed: 04/23/2014 Elsevier Interactive Patient Education [employer]2016 Elsevier Inc.

## 2015-09-03 ENCOUNTER — Ambulatory Visit: Payer: BC Managed Care – PPO | Admitting: Orthopedic Surgery

## 2015-09-07 ENCOUNTER — Telehealth: Payer: Self-pay | Admitting: Orthopedic Surgery

## 2015-09-07 NOTE — Telephone Encounter (Signed)
Regarding out-patient surgery scheduled at [facility] 09/23/15, CPT [id number], contacted insurer, [employer]; per Ashtine J, no pre-authorization is required; must meet medical criteria and guidelines.  Her name and date for reference: Ashtine J, 09/07/15, 3:23p.m.

## 2015-09-16 NOTE — Patient Instructions (Signed)
Robin Dixon  09/16/2015     @PREFPERIOPPHARMACY @   Your procedure is scheduled on  09/23/2015   [facility]  [facility]  A.M.  Call this number if you have problems the morning of surgery:  [phone]   Remember:  Do not eat food or drink liquids after midnight.  Take these medicines the morning of surgery with A SIP OF WATER  Xanax, lexapro, neurontin, metoprolol, zofran, protonix.   Do not wear jewelry, make-up or nail polish.  Do not wear lotions, powders, or perfumes.  You may wear deodorant.  Do not shave 48 hours prior to surgery.  Men may shave face and neck.  Do not bring valuables to the hospital.  [facility] is not responsible for any belongings or valuables.  Contacts, dentures or bridgework may not be worn into surgery.  Leave your suitcase in the car.  After surgery it may be brought to your room.  For patients admitted to the hospital, discharge time will be determined by your treatment team.  Patients discharged the day of surgery will not be allowed to drive home.   Name and phone number of your driver:   family Special instructions:  none  Please read over the following fact sheets that you were given. Pain Booklet, Coughing and Deep Breathing, Surgical Site Infection Prevention, Anesthesia Post-op Instructions and Care and Recovery After Surgery      Carpal Tunnel Release Carpal tunnel release is a surgical procedure to relieve numbness and pain in your hand that are caused by carpal tunnel syndrome. Your carpal tunnel is a narrow, hollow space in your wrist. It passes between your wrist bones and a band of connective tissue (transverse carpal ligament). The nerve that supplies most of your hand (median nerve) passes through this space, and so do the connections between your fingers and the muscles of your arm (tendons). Carpal tunnel syndrome makes this space swell and become narrow, and this causes pain and numbness. In carpal tunnel  release surgery, a surgeon cuts through the transverse carpal ligament to make more room in the carpal tunnel space. You may have this surgery if other types of treatment have not worked. LET [facility] PROVIDER KNOW ABOUT:  Any allergies you have.  All medicines you are taking, including vitamins, herbs, eye drops, creams, and over-the-counter medicines.  Previous problems you or members of your family have had with the use of anesthetics.  Any blood disorders you have.  Previous surgeries you have had.  Medical conditions you have. RISKS AND COMPLICATIONS Generally, this is a safe procedure. However, problems may occur, including:  Bleeding.  Infection.  Injury to the median nerve.  Need for additional surgery. BEFORE THE PROCEDURE  Ask your health care provider about:  Changing or stopping your regular medicines. This is especially important if you are taking diabetes medicines or blood thinners.  Taking medicines such as aspirin and ibuprofen. These medicines can thin your blood. Do not take these medicines before your procedure if your health care provider instructs you not to.  Do not eat or drink anything after midnight on the night before the procedure or as directed by your health care provider.  Plan to have someone take you home after the procedure. PROCEDURE  An IV tube may be inserted into a vein.  You will be given one of the following:  A medicine that numbs the wrist area (local anesthetic). You may also  be given a medicine to make you relax (sedative).  A medicine that makes you go to sleep (general anesthetic).  Your arm, hand, and wrist will be cleaned with a germ-killing solution (antiseptic).  Your surgeon will make a surgical cut (incision) over the palm side of your wrist. The surgeon will pull aside the skin of your wrist to expose the carpal tunnel space.  The surgeon will cut the transverse carpal ligament.  The edges of the incision  will be closed with stitches (sutures) or staples.  A bandage (dressing) will be placed over your wrist and wrapped around your hand and wrist. AFTER THE PROCEDURE  You may spend some time in a recovery area.  Your blood pressure, heart rate, breathing rate, and blood oxygen level will be monitored often until the medicines you were given have worn off.  You will likely have some pain. You will be given pain medicine.  You may need to wear a splint or a wrist brace over your dressing.   This information is not intended to replace advice given to you by your health care provider. Make sure you discuss any questions you have with your health care provider.   Document Released: 11/26/2003 Document Revised: 09/26/2014 Document Reviewed: 04/23/2014 Elsevier Interactive Patient Education 2016 Elsevier Inc. PATIENT INSTRUCTIONS POST-ANESTHESIA  IMMEDIATELY FOLLOWING SURGERY:  Do not drive or operate machinery for the first twenty four hours after surgery.  Do not make any important decisions for twenty four hours after surgery or while taking narcotic pain medications or sedatives.  If you develop intractable nausea and vomiting or a severe headache please notify your doctor immediately.  FOLLOW-UP:  Please make an appointment with your surgeon as instructed. You do not need to follow up with anesthesia unless specifically instructed to do so.  WOUND CARE INSTRUCTIONS (if applicable):  Keep a dry clean dressing on the anesthesia/puncture wound site if there is drainage.  Once the wound has quit draining you may leave it open to air.  Generally you should leave the bandage intact for twenty four hours unless there is drainage.  If the epidural site drains for more than 36-48 hours please call the anesthesia department.  QUESTIONS?:  Please feel free to call your physician or the hospital operator if you have any questions, and they will be happy to assist you.

## 2015-09-17 ENCOUNTER — Encounter (HOSPITAL_COMMUNITY): Payer: Self-pay

## 2015-09-17 ENCOUNTER — Encounter (HOSPITAL_COMMUNITY)
Admission: RE | Admit: 2015-09-17 | Discharge: 2015-09-17 | Disposition: A | Discharge status: Home / Self Care | Payer: BC Managed Care – PPO | Source: Ambulatory Visit | Attending: Orthopedic Surgery | Admitting: Orthopedic Surgery

## 2015-09-17 ENCOUNTER — Other Ambulatory Visit: Payer: Self-pay

## 2015-09-17 DIAGNOSIS — Z01818 Encounter for other preprocedural examination: Secondary | ICD-10-CM | POA: Insufficient documentation

## 2015-09-17 DIAGNOSIS — G5601 Carpal tunnel syndrome, right upper limb: Secondary | ICD-10-CM | POA: Insufficient documentation

## 2015-09-17 LAB — CBC
HCT: 37.7 % (ref 36.0–46.0)
Hemoglobin: 12.5 g/dL (ref 12.0–15.0)
MCH: 31.3 pg (ref 26.0–34.0)
MCHC: 33.2 g/dL (ref 30.0–36.0)
MCV: 94.5 fL (ref 78.0–100.0)
PLATELETS: 239 10*3/uL (ref 150–400)
RBC: 3.99 MIL/uL (ref 3.87–5.11)
RDW: 12.8 % (ref 11.5–15.5)
WBC: 7.4 10*3/uL (ref 4.0–10.5)

## 2015-09-17 LAB — BASIC METABOLIC PANEL
Anion gap: 8 (ref 5–15)
BUN: 20 mg/dL (ref 6–20)
CO2: 27 mmol/L (ref 22–32)
CREATININE: 0.81 mg/dL (ref 0.44–1.00)
Calcium: 9.5 mg/dL (ref 8.9–10.3)
Chloride: 101 mmol/L (ref 101–111)
Glucose, Bld: 84 mg/dL (ref 65–99)
POTASSIUM: 4.3 mmol/L (ref 3.5–5.1)
SODIUM: 136 mmol/L (ref 135–145)

## 2015-09-18 ENCOUNTER — Telehealth: Payer: Self-pay | Admitting: Family Medicine

## 2015-09-18 MED ORDER — ONDANSETRON HCL 8 MG PO TABS: 8.0000 mg | ORAL_TABLET | Freq: Three times a day (TID) | ORAL | Status: DC | PRN

## 2015-09-18 NOTE — Telephone Encounter (Signed)
Med sent to pharmacy. Patient was notified.  

## 2015-09-18 NOTE — Telephone Encounter (Signed)
Pt is calling to say that she thinks she has food poisoning  °Vomiting an diarrhea since she ate something last night.  °Has some nausea meds at home, should she take it?  ° °Please advise as to what they should do?  ° °[facility]  °

## 2015-09-18 NOTE — Telephone Encounter (Signed)
The patient may use Zofran every 6-8 hours as needed for nausea. Typically the vomiting associated with this type of illness will be pretty severe for the first 12-24 hours then should get better. If not able to take things well by tomorrow morning then should consider going to ER. The best approach is small amounts of clear liquids frequently. Typically after vomiting we recommend to wait 20 minutes then may take 1/2-1 ounce at a time every 5 minutes as tolerated. If vomits wait 20 minutes and start again. If no vomiting over an hour then may start increasing the amount that may take in at one time. Land food may be used once tolerating liquids. If high fevers bloody stools or worse seek further evaluation/ER if needs further Zofran sent in May send in Zofran 8 mg, 1 3 times a day, #20

## 2015-09-22 NOTE — H&P (Signed)
Chief Complaint   Patient presents with   .  Follow-up       6 week follow up Rt carpal tunnel    HPI Robin Dixon is a 50 y.o. female.  The patient is here today to evaluate her for right carpal tunnel syndrome. She has a couple month history of pain with numbness and tingling and some swelling in her right hand and wrist. Sometimes it feels swollen and she complains of a dull ache in the median nerve distribution. Symptoms currently are more in the morning and in the evenings and the pain is rated 7 out of 10 but she has not had any treatment she did have a nerve conduction study from [facility] neurology and from what I can see it appears that her sensory latency is longer on the right but the official interpretation not currently available  She reports that her symptoms have not improved  Review of systems noted on 07/23/2015 have not changed she has postnasal drip cough and palpitations hematuria numbness and nervousness and anxiety  Past Medical History  Diagnosis Date  . Hypertension   . Anxiety 03/29/2013  . Hot flashes 03/29/2013  . PMB (postmenopausal bleeding) 06/12/2013    US WNL  . History of neck injury   . Mental disorder     anxiety depression  . Carpal tunnel syndrome 07/02/2015    Right   Past Surgical History  Procedure Laterality Date  . Cholecystectomy      ROXBORO-GALLSTONES  . Colonoscopy N/A 12/27/2013    Procedure: COLONOSCOPY;  Surgeon: West Bali, MD;  [facility];  Service: Endoscopy;  Laterality: N/A;  1:45  . Esophagogastroduodenoscopy N/A 12/27/2013    Procedure: ESOPHAGOGASTRODUODENOSCOPY (EGD);  Surgeon: West Bali, MD;  [facility];  Service: Endoscopy;  Laterality: N/A;   Family History  Problem Relation Age of Onset  . Diabetes Mother   . Hypertension Mother   . Stroke Mother   . Cancer Mother     blood  . COPD Sister   . Hypertension Sister   . Cancer Brother     melanoma  . Colon cancer Neg Hx   . Colon  polyps Neg Hx   . Hypertension Sister    Social History  Substance Use Topics  . Smoking status: Former Smoker    Types: Cigarettes    Quit date: 02/22/1998  . Smokeless tobacco: Never Used     Comment: quit years ago  . Alcohol Use: Yes     Comment: occ   VITAL signs are stable her appearance is normal. No significant swelling or varicose veins pulses are palpable at 2+ temperature normal no edema or tenderness in the hands. Lymph nodes are negative.  Skin is warm dry and intact without caf au lait spots ulcers lesions rashes or scars  Coordination test finger to nose is normal reflexes are intact 2+ and equal. She has decreased sensation in the right hand median nerve distribution she is alert and oriented 3 no depression anxiety or agitation  Gait and station are normal  She has tenderness over the carpal tunnel but normal range of motion of the wrist and hand, stability tests are normal strength shows slight weakness. She has tenderness And positive compression test of the carpal tunnel of the right   We treated her with gabapentin, naproxen carpal tunnel splinting and she has not improved  We discussed continued medical management versus surgical intervention. #1 complication being sensitivity of  the scar. Chance of surgical relief 95% chance of 95% relief symptoms  Patient is opted for surgical intervention.  We will schedule her for surgery at her earliest convenience.   right carpal tunnel syndrome   Right carpal tunnel release

## 2015-09-23 ENCOUNTER — Ambulatory Visit (HOSPITAL_COMMUNITY): Payer: BC Managed Care – PPO | Admitting: Anesthesiology

## 2015-09-23 ENCOUNTER — Encounter (HOSPITAL_COMMUNITY)
Admission: RE | Disposition: A | Discharge status: Home / Self Care | Payer: Self-pay | Source: Ambulatory Visit | Attending: Orthopedic Surgery

## 2015-09-23 ENCOUNTER — Encounter (HOSPITAL_COMMUNITY): Payer: Self-pay | Admitting: *Deleted

## 2015-09-23 ENCOUNTER — Ambulatory Visit (HOSPITAL_COMMUNITY)
Admission: RE | Admit: 2015-09-23 | Discharge: 2015-09-23 | Disposition: A | Discharge status: Home / Self Care | Payer: BC Managed Care – PPO | Source: Ambulatory Visit | Attending: Orthopedic Surgery | Admitting: Orthopedic Surgery

## 2015-09-23 DIAGNOSIS — K219 Gastro-esophageal reflux disease without esophagitis: Secondary | ICD-10-CM | POA: Diagnosis not present

## 2015-09-23 DIAGNOSIS — I10 Essential (primary) hypertension: Secondary | ICD-10-CM | POA: Insufficient documentation

## 2015-09-23 DIAGNOSIS — F418 Other specified anxiety disorders: Secondary | ICD-10-CM | POA: Diagnosis not present

## 2015-09-23 DIAGNOSIS — G5601 Carpal tunnel syndrome, right upper limb: Secondary | ICD-10-CM | POA: Diagnosis not present

## 2015-09-23 DIAGNOSIS — Z87891 Personal history of nicotine dependence: Secondary | ICD-10-CM | POA: Insufficient documentation

## 2015-09-23 DIAGNOSIS — Z79899 Other long term (current) drug therapy: Secondary | ICD-10-CM | POA: Insufficient documentation

## 2015-09-23 DIAGNOSIS — Z9049 Acquired absence of other specified parts of digestive tract: Secondary | ICD-10-CM | POA: Insufficient documentation

## 2015-09-23 HISTORY — PX: CARPAL TUNNEL RELEASE: SHX101

## 2015-09-23 SURGERY — CARPAL TUNNEL RELEASE
Anesthesia: Regional | Site: Wrist | Laterality: Right

## 2015-09-23 MED ORDER — PROPOFOL 500 MG/50ML IV EMUL
INTRAVENOUS | Status: DC | PRN
Start: 2015-09-23 — End: 2015-09-23
  Administered 2015-09-23: 100 ug/kg/min via INTRAVENOUS

## 2015-09-23 MED ORDER — ONDANSETRON HCL 4 MG/2ML IJ SOLN: 4.0000 mg | Freq: Once | INTRAMUSCULAR | Status: DC | PRN

## 2015-09-23 MED ORDER — LIDOCAINE HCL (PF) 1 % IJ SOLN
INTRAMUSCULAR | Status: AC
  Filled 2015-09-23: qty 30

## 2015-09-23 MED ORDER — CEFAZOLIN SODIUM-DEXTROSE 2-3 GM-% IV SOLR
INTRAVENOUS | Status: AC
Start: 2015-09-23 — End: 2015-09-23
  Filled 2015-09-23: qty 50

## 2015-09-23 MED ORDER — FENTANYL CITRATE (PF) 100 MCG/2ML IJ SOLN
INTRAMUSCULAR | Status: AC
  Filled 2015-09-23: qty 2

## 2015-09-23 MED ORDER — MIDAZOLAM HCL 2 MG/2ML IJ SOLN
1.0000 mg | INTRAMUSCULAR | Status: DC | PRN
Start: 2015-09-23 — End: 2015-09-23
  Administered 2015-09-23: 2 mg via INTRAVENOUS

## 2015-09-23 MED ORDER — SODIUM CHLORIDE 0.9 % IJ SOLN
INTRAMUSCULAR | Status: AC
  Filled 2015-09-23: qty 10

## 2015-09-23 MED ORDER — FENTANYL CITRATE (PF) 100 MCG/2ML IJ SOLN: 25.0000 ug | INTRAMUSCULAR | Status: DC | PRN

## 2015-09-23 MED ORDER — BUPIVACAINE HCL (PF) 0.5 % IJ SOLN
INTRAMUSCULAR | Status: AC
  Filled 2015-09-23: qty 30

## 2015-09-23 MED ORDER — MIDAZOLAM HCL 2 MG/2ML IJ SOLN
INTRAMUSCULAR | Status: AC
  Filled 2015-09-23: qty 2

## 2015-09-23 MED ORDER — CEFAZOLIN SODIUM-DEXTROSE 2-3 GM-% IV SOLR
2.0000 g | INTRAVENOUS | Status: AC
  Administered 2015-09-23: 2 g via INTRAVENOUS

## 2015-09-23 MED ORDER — SUCCINYLCHOLINE CHLORIDE 20 MG/ML IJ SOLN
INTRAMUSCULAR | Status: AC
  Filled 2015-09-23: qty 1

## 2015-09-23 MED ORDER — HYDROCODONE-ACETAMINOPHEN 7.5-325 MG PO TABS: 1.0000 | ORAL_TABLET | Freq: Four times a day (QID) | ORAL | Status: DC | PRN

## 2015-09-23 MED ORDER — 0.9 % SODIUM CHLORIDE (POUR BTL) OPTIME
TOPICAL | Status: DC | PRN
  Administered 2015-09-23: 1000 mL

## 2015-09-23 MED ORDER — EPHEDRINE SULFATE 50 MG/ML IJ SOLN
INTRAMUSCULAR | Status: AC
  Filled 2015-09-23: qty 1

## 2015-09-23 MED ORDER — MIDAZOLAM HCL 5 MG/5ML IJ SOLN
INTRAMUSCULAR | Status: DC | PRN
  Administered 2015-09-23: 2 mg via INTRAVENOUS

## 2015-09-23 MED ORDER — LIDOCAINE HCL (PF) 0.5 % IJ SOLN
INTRAMUSCULAR | Status: DC | PRN
  Administered 2015-09-23: 40 mL

## 2015-09-23 MED ORDER — SODIUM CHLORIDE 0.9 % IJ SOLN
INTRAMUSCULAR | Status: AC
  Filled 2015-09-23: qty 3

## 2015-09-23 MED ORDER — LACTATED RINGERS IV SOLN
INTRAVENOUS | Status: DC
  Administered 2015-09-23: 10:00:00 via INTRAVENOUS

## 2015-09-23 MED ORDER — BUPIVACAINE HCL (PF) 0.5 % IJ SOLN
INTRAMUSCULAR | Status: DC | PRN
  Administered 2015-09-23: 10 mL

## 2015-09-23 MED ORDER — FENTANYL CITRATE (PF) 100 MCG/2ML IJ SOLN
25.0000 ug | INTRAMUSCULAR | Status: AC
  Administered 2015-09-23 (×2): 25 ug via INTRAVENOUS

## 2015-09-23 MED ORDER — PROPOFOL 10 MG/ML IV BOLUS
INTRAVENOUS | Status: AC
  Filled 2015-09-23: qty 40

## 2015-09-23 MED ORDER — LIDOCAINE HCL (PF) 0.5 % IJ SOLN
INTRAMUSCULAR | Status: AC
  Filled 2015-09-23: qty 50

## 2015-09-23 MED ORDER — CHLORHEXIDINE GLUCONATE 4 % EX LIQD: 60.0000 mL | Freq: Once | CUTANEOUS | Status: DC

## 2015-09-23 SURGICAL SUPPLY — 38 items
BAG HAMPER (MISCELLANEOUS) ×3 IMPLANT
BANDAGE ELASTIC 3 VELCRO NS (GAUZE/BANDAGES/DRESSINGS) ×3 IMPLANT
BANDAGE ESMARK 4X12 BL STRL LF (DISPOSABLE) ×1 IMPLANT
BLADE SURG 15 STRL LF DISP TIS (BLADE) ×1 IMPLANT
BLADE SURG 15 STRL SS (BLADE) ×2
BNDG COHESIVE 4X5 TAN STRL (GAUZE/BANDAGES/DRESSINGS) ×3 IMPLANT
BNDG ESMARK 4X12 BLUE STRL LF (DISPOSABLE) ×3
BNDG GAUZE ELAST 4 BULKY (GAUZE/BANDAGES/DRESSINGS) ×3 IMPLANT
CHLORAPREP W/TINT 26ML (MISCELLANEOUS) ×3 IMPLANT
CLOTH BEACON ORANGE TIMEOUT ST (SAFETY) ×3 IMPLANT
COVER LIGHT HANDLE STERIS (MISCELLANEOUS) ×6 IMPLANT
CUFF TOURNIQUET SINGLE 18IN (TOURNIQUET CUFF) ×3 IMPLANT
DECANTER SPIKE VIAL GLASS SM (MISCELLANEOUS) ×3 IMPLANT
DRAPE PROXIMA HALF (DRAPES) ×3 IMPLANT
DRSG XEROFORM 1X8 (GAUZE/BANDAGES/DRESSINGS) ×3 IMPLANT
ELECT NEEDLE TIP 2.8 STRL (NEEDLE) ×3 IMPLANT
ELECT REM PT RETURN 9FT ADLT (ELECTROSURGICAL) ×3
ELECTRODE REM PT RTRN 9FT ADLT (ELECTROSURGICAL) ×1 IMPLANT
GAUZE SPONGE 4X4 12PLY STRL (GAUZE/BANDAGES/DRESSINGS) IMPLANT
GLOVE BIOGEL PI IND STRL 7.0 (GLOVE) ×1 IMPLANT
GLOVE BIOGEL PI INDICATOR 7.0 (GLOVE) ×2
GLOVE SKINSENSE NS SZ8.0 LF (GLOVE) ×2
GLOVE SKINSENSE STRL SZ8.0 LF (GLOVE) ×1 IMPLANT
GLOVE SS N UNI LF 8.5 STRL (GLOVE) ×3 IMPLANT
GOWN STRL REUS W/ TWL LRG LVL3 (GOWN DISPOSABLE) ×1 IMPLANT
GOWN STRL REUS W/TWL LRG LVL3 (GOWN DISPOSABLE) ×2
GOWN STRL REUS W/TWL XL LVL3 (GOWN DISPOSABLE) ×3 IMPLANT
HAND ALUMI XLG (SOFTGOODS) ×3 IMPLANT
KIT ROOM TURNOVER APOR (KITS) ×3 IMPLANT
MANIFOLD NEPTUNE II (INSTRUMENTS) ×3 IMPLANT
NEEDLE HYPO 21X1.5 SAFETY (NEEDLE) ×3 IMPLANT
NS IRRIG 1000ML POUR BTL (IV SOLUTION) ×3 IMPLANT
PACK BASIC LIMB (CUSTOM PROCEDURE TRAY) ×3 IMPLANT
PAD ARMBOARD 7.5X6 YLW CONV (MISCELLANEOUS) ×3 IMPLANT
SET BASIN LINEN APH (SET/KITS/TRAYS/PACK) ×3 IMPLANT
SPONGE GAUZE 4X4 12PLY (GAUZE/BANDAGES/DRESSINGS) ×3 IMPLANT
SUT ETHILON 3 0 FSL (SUTURE) ×3 IMPLANT
SYR CONTROL 10ML LL (SYRINGE) ×3 IMPLANT

## 2015-09-23 NOTE — Discharge Instructions (Signed)
General Anesthesia, Adult °General anesthesia is a sleep-like state of non-feeling produced by medicines (anesthetics). General anesthesia prevents you from being alert and feeling pain during a medical procedure. Your caregiver may recommend general anesthesia if your procedure: °· Is long. °· Is painful or uncomfortable. °· Would be frightening to see or hear. °· Requires you to be still. °· Affects your breathing. °· Causes significant blood loss. °LET YOUR CAREGIVER KNOW ABOUT: °· Allergies to food or medicine. °· Medicines taken, including vitamins, herbs, eyedrops, over-the-counter medicines, and creams. °· Use of steroids (by mouth or creams). °· Previous problems with anesthetics or numbing medicines, including problems experienced by relatives. °· History of bleeding problems or blood clots. °· Previous surgeries and types of anesthetics received. °· Possibility of pregnancy, if this applies. °· Use of cigarettes, alcohol, or illegal drugs. °· Any health condition(s), especially diabetes, sleep apnea, and high blood pressure. °RISKS AND COMPLICATIONS °General anesthesia rarely causes complications. However, if complications do occur, they can be life threatening. Complications include: °· A lung infection. °· A stroke. °· A heart attack. °· Waking up during the procedure. When this occurs, the patient may be unable to move and communicate that he or she is awake. The patient may feel severe pain. °Older adults and adults with serious medical problems are more likely to have complications than adults who are young and healthy. Some complications can be prevented by answering all of your caregiver's questions thoroughly and by following all pre-procedure instructions. It is important to tell your caregiver if any of the pre-procedure instructions, especially those related to diet, were not followed. Any food or liquid in the stomach can cause problems when you are under general anesthesia. °BEFORE THE  PROCEDURE °· Ask your caregiver if you will have to spend the night at the hospital. If you will not have to spend the night, arrange to have an adult drive you and stay with you for 24 hours. °· Follow your caregiver's instructions if you are taking dietary supplements or medicines. Your caregiver may tell you to stop taking them or to reduce your dosage. °· Do not smoke for as long as possible before your procedure. If possible, stop smoking 3-6 weeks before the procedure. °· Do not take new dietary supplements or medicines within 1 week of your procedure unless your caregiver approves them. °· Do not eat within 8 hours of your procedure or as directed by your caregiver. Drink only clear liquids, such as water, black coffee (without milk or cream), and fruit juices (without pulp). °· Do not drink within 3 hours of your procedure or as directed by your caregiver. °· You may brush your teeth on the morning of the procedure, but make sure to spit out the toothpaste and water when finished. °PROCEDURE  °You will receive anesthetics through a mask, through an intravenous (IV) access tube, or through both. A doctor who specializes in anesthesia (anesthesiologist) or a nurse who specializes in anesthesia (nurse anesthetist) or both will stay with you throughout the procedure to make sure you remain unconscious. He or she will also watch your blood pressure, pulse, and oxygen levels to make sure that the anesthetics do not cause any problems. Once you are asleep, a breathing tube or mask may be used to help you breathe. °AFTER THE PROCEDURE °You will wake up after the procedure is complete. You may be in the room where the procedure was performed or in a recovery area. You may have a sore throat   if a breathing tube was used. You may also feel:  Dizzy.  Weak.  Drowsy.  Confused.  Nauseous.  Cold. These are all normal responses and can be expected to last for up to 24 hours after the procedure is complete. A  caregiver will tell you when you are ready to go home. This will usually be when you are fully awake and in stable condition.   This information is not intended to replace advice given to you by your health care provider. Make sure you discuss any questions you have with your health care provider.   Document Released: 12/13/2007 Document Revised: 09/26/2014 Document Reviewed: 01/04/2012 Elsevier Interactive Patient Education 2016 Elsevier Inc. Carpal Tunnel Release, Care After Refer to this sheet in the next few weeks. These instructions provide you with information about caring for yourself after your procedure. Your health care provider may also give you more specific instructions. Your treatment has been planned according to current medical practices, but problems sometimes occur. Call your health care provider if you have any problems or questions after your procedure. WHAT TO EXPECT AFTER THE PROCEDURE After your procedure, it is typical to have the following:  Pain.  Numbness.  Tingling.  Swelling.  Stiffness.  Bruising. HOME CARE INSTRUCTIONS  Take medicines only as directed by your health care provider.  There are many different ways to close and cover an incision, including stitches (sutures), skin glue, and adhesive strips. Follow your health care provider's instructions about:  Incision care.  Bandage (dressing) changes and removal.  Incision closure removal.  Wear a splint or a brace as directed by your surgeon. You may need to do this for 2-3 weeks.  Keep your hand raised (elevated) above the level of your heart while you are resting. Move your fingers often.  Avoid activities that cause hand pain.  Ask your surgeon when you can start to do all of your usual activities again, such as:  Driving.  Returning to work.  Bathing and swimming.  Keep all follow-up visits as directed by your health care provider. This is important. You may need physical therapy  for several months to speed healing and regain movement. SEEK MEDICAL CARE IF:  You have drainage, redness, swelling, or pain at your incision site.  You have a fever.  You have chills.  Your pain medicine is not working.  Your symptoms do not go away after 2 months.  Your symptoms go away and then return. SEEK IMMEDIATE MEDICAL CARE IF:  You have pain or numbness that is getting worse.  Your fingers change color.  You are not able to move your fingers.   This information is not intended to replace advice given to you by your health care provider. Make sure you discuss any questions you have with your health care provider.   Document Released: 03/25/2005 Document Revised: 09/26/2014 Document Reviewed: 04/23/2014 Elsevier Interactive Patient Education [employer] Elsevier Inc.

## 2015-09-23 NOTE — Op Note (Signed)
Carpal tunnel release right wrist  Preop diagnosis carpal tunnel syndrome right wrist   postop diagnosis same  Procedure open carpal tunnel release right wrist  Surgeon Harrison  Anesthesia regional Bier block  Operative findings compression of the right median nerveoccupied lesions  Indications failure of conservative treatment to relieve pain and paresthesias and numbness and tingling of the right hand.  The patient was identified in the preop area we confirm the surgical site marked as right wrist. Chart update completed. Patient taken to surgery. She had 2 g of Ancef. After establishing a Bier block her arm was prepped with ChloraPrep.  Timeout executed completed and confirmed site.  A straight incision was made over the carpal tunnel in line with the radial border of the ring finger. Blunt dissection was carried out to find the distal aspect of the carpal tunnel. A blunted judgment was passed beneath the carpal tunnel. Sharp incision was then used to release the transverse carpal ligament. The contents of the carpal tunnel were inspected. The median nerve was compressed with slight discoloration.  The wound was irrigated and then closed with 3-0 nylon suture. We injected 10 mL of plain Marcaine on the radial side of the incision  A sterile bandage was applied and the tourniquet was released the color of the hand and capillary refill were normal  The patient was taken to the recovery room in stable condition 

## 2015-09-23 NOTE — Anesthesia Preprocedure Evaluation (Signed)
Anesthesia Evaluation  Patient identified by MRN, date of birth, ID band Patient awake    Reviewed: Allergy & Precautions, NPO status , Patient's Chart, lab work & pertinent test results, reviewed documented beta blocker date and time   Airway Mallampati: II  TM Distance: >3 FB     Dental  (+) Teeth Intact   Pulmonary former smoker,    breath sounds clear to auscultation       Cardiovascular hypertension, Pt. on medications and Pt. on home beta blockers  Rhythm:Regular Rate:Normal     Neuro/Psych PSYCHIATRIC DISORDERS Anxiety Depression  Neuromuscular disease    GI/Hepatic GERD  Medicated and Controlled,  Endo/Other    Renal/GU      Musculoskeletal   Abdominal   Peds  Hematology   Anesthesia Other Findings   Reproductive/Obstetrics                             Anesthesia Physical Anesthesia Plan  ASA: II  Anesthesia Plan: Bier Block   Post-op Pain Management:    Induction: Intravenous  Airway Management Planned: Simple Face Mask  Additional Equipment:   Intra-op Plan:   Post-operative Plan:   Informed Consent: I have reviewed the patients History and Physical, chart, labs and discussed the procedure including the risks, benefits and alternatives for the proposed anesthesia with the patient or authorized representative who has indicated his/her understanding and acceptance.     Plan Discussed with:   Anesthesia Plan Comments:         Anesthesia Quick Evaluation

## 2015-09-23 NOTE — Anesthesia Postprocedure Evaluation (Signed)
Anesthesia Post Note  Patient: Caron Presumeeresa D Cuffee  Procedure(s) Performed: Procedure(s) (LRB): RIGHT CARPAL TUNNEL RELEASE (Right)  Patient location during evaluation: PACU Anesthesia Type: Bier Block Level of consciousness: oriented and awake and alert Pain management: pain level controlled Vital Signs Assessment: post-procedure vital signs reviewed and stable Respiratory status: spontaneous breathing and respiratory function stable Cardiovascular status: stable Postop Assessment: no signs of nausea or vomiting Anesthetic complications: no    Last Vitals:  Filed Vitals:   09/23/15 0950 09/23/15 0955  BP: 150/80 150/80  Pulse:    Temp:    Resp: 21 14    Last Pain: There were no vitals filed for this visit.               Jeremia Groot A

## 2015-09-23 NOTE — Anesthesia Procedure Notes (Addendum)
Procedure Name: MAC Date/Time: 09/23/2015 10:00 AM Performed by: Pernell DupreADAMS, AMY A Pre-anesthesia Checklist: Patient identified, Timeout performed, Emergency Drugs available, Suction available and Patient being monitored Oxygen Delivery Method: Simple face mask   Anesthesia Regional Block:  Bier block (IV Regional)  Pre-Anesthetic Checklist: ,, timeout performed, Correct Patient, Correct Site, Correct Laterality, Correct Procedure,, site marked, surgical consent,, at surgeon's request  Laterality: Right     Needles:  Injection technique: Single-shot  Needle Type: Other      Needle Gauge: 22 and 22 G    Additional Needles: Bier block (IV Regional)  Nerve Stimulator or Paresthesia:   Additional Responses:  Pulse checked post tourniquet inflation. IV NSL discontinued post injection. Narrative:   Performed by: Personally

## 2015-09-23 NOTE — Interval H&P Note (Signed)
History and Physical Interval Note:  09/23/2015 9:37 AM  Robin Dixon  has presented today for surgery, with the diagnosis of right carpal tunnel syndrome  The various methods of treatment have been discussed with the patient and family. After consideration of risks, benefits and other options for treatment, the patient has consented to  Procedure(s): CARPAL TUNNEL RELEASE (Right) as a surgical intervention .  The patient's history has been reviewed, patient examined, no change in status, stable for surgery.  I have reviewed the patient's chart and labs.  Questions were answered to the patient's satisfaction.     Fuller CanadaStanley Nikeia Henkes

## 2015-09-23 NOTE — Brief Op Note (Signed)
09/23/2015  10:35 AM  PATIENT:  Robin Dixon  50 y.o. female  PRE-OPERATIVE DIAGNOSIS:  right carpal tunnel syndrome  POST-OPERATIVE DIAGNOSIS:  right carpal tunnel syndrome  FINDINGS: COMPRESSED MEDIAN NERVE   PROCEDURE:  Procedure(s): RIGHT CARPAL TUNNEL RELEASE (Right)  SURGEON:  Surgeon(s) and Role:    * Vickki HearingStanley E Maevis Mumby, MD - Primary  PHYSICIAN ASSISTANT:   ASSISTANTS: none   ANESTHESIA:   none  EBL:  Total I/O In: 800 [I.V.:800] Out: 2 [Blood:2]  BLOOD ADMINISTERED:none  DRAINS: none   LOCAL MEDICATIONS USED:  MARCAINE     SPECIMEN:  No Specimen  DISPOSITION OF SPECIMEN:  N/A  COUNTS:  YES  TOURNIQUET:  * Missing tourniquet times found for documented tourniquets in log:  [id number] *  DICTATION: .Dragon Dictation  PLAN OF CARE: Discharge to home after PACU  PATIENT DISPOSITION:  PACU - hemodynamically stable.   Delay start of Pharmacological VTE agent (>24hrs) due to surgical blood loss or risk of bleeding: not applicable

## 2015-09-23 NOTE — Transfer of Care (Signed)
Immediate Anesthesia Transfer of Care Note  Patient: Robin Dixon  Procedure(s) Performed: Procedure(s): RIGHT CARPAL TUNNEL RELEASE (Right)  Patient Location: PACU  Anesthesia Type:Bier block  Level of Consciousness: awake, alert , oriented and patient cooperative  Airway & Oxygen Therapy: Patient Spontanous Breathing and Patient connected to nasal cannula oxygen  Post-op Assessment: Report given to RN and Post -op Vital signs reviewed and stable  Post vital signs: Reviewed and stable  Last Vitals:  Filed Vitals:   09/23/15 0950 09/23/15 0955  BP: 150/80 150/80  Pulse:    Temp:    Resp: 21 14    Complications: No apparent anesthesia complications

## 2015-09-24 ENCOUNTER — Encounter (HOSPITAL_COMMUNITY): Payer: Self-pay | Admitting: Orthopedic Surgery

## 2015-09-28 ENCOUNTER — Ambulatory Visit (INDEPENDENT_AMBULATORY_CARE_PROVIDER_SITE_OTHER): Payer: BC Managed Care – PPO | Admitting: Orthopedic Surgery

## 2015-09-28 VITALS — BP 126/76 | Ht 66.0 in | Wt 185.0 lb

## 2015-09-28 DIAGNOSIS — G5601 Carpal tunnel syndrome, right upper limb: Secondary | ICD-10-CM

## 2015-09-28 DIAGNOSIS — Z4789 Encounter for other orthopedic aftercare: Secondary | ICD-10-CM

## 2015-09-28 NOTE — Progress Notes (Signed)
  Robin Dixon is status post right carpal tunnel release   S:   Chief Complaint  Patient presents with  . Follow-up    Post op #1, right CTR. DOS 09-23-15.   Doing well.   The patient complains of  no numbness or tingling  Current pain medication: Hydrocodone 7.5  The incision is clean dry and intact with no drainage The dressing was changed.  Patient to return for suture removal ~ POD 12-14

## 2015-10-01 ENCOUNTER — Telehealth: Payer: Self-pay | Admitting: *Deleted

## 2015-10-01 NOTE — Telephone Encounter (Signed)
Patient called stating she had carpel tunnel surgery on her right hand 09/23/15 and yesterday 09/30/15 she folded clothes and she fed her chicken and one of her chickens got loose and she had to catch it and since she has done that her right hand has been hurting, patient said she still has some hydrocodone left but she hasn't took one, patient said she is having a lot of pain in her right hand and she would like to know if the pain could be coming from what she did yesterday. Please advise [phone]

## 2015-10-01 NOTE — Telephone Encounter (Signed)
Routing to Dr Harrison for review 

## 2015-10-06 ENCOUNTER — Ambulatory Visit (INDEPENDENT_AMBULATORY_CARE_PROVIDER_SITE_OTHER): Payer: BC Managed Care – PPO | Admitting: Orthopedic Surgery

## 2015-10-06 VITALS — BP 120/79 | Ht 66.0 in | Wt 185.0 lb

## 2015-10-06 DIAGNOSIS — G5601 Carpal tunnel syndrome, right upper limb: Secondary | ICD-10-CM

## 2015-10-06 DIAGNOSIS — Z4789 Encounter for other orthopedic aftercare: Secondary | ICD-10-CM

## 2015-10-06 MED ORDER — GABAPENTIN 100 MG PO CAPS: 100.0000 mg | ORAL_CAPSULE | Freq: Three times a day (TID) | ORAL | Status: DC

## 2015-10-06 MED ORDER — CEPHALEXIN 500 MG PO CAPS: 500.0000 mg | ORAL_CAPSULE | Freq: Two times a day (BID) | ORAL | Status: DC

## 2015-10-06 NOTE — Patient Instructions (Signed)
MEDICATION SENT TO YOUR PHARMACY 

## 2015-10-06 NOTE — Progress Notes (Signed)
Postop visit  Postop visit #2 stitches are removed  Surgery was January 4 this is postoperative day 13  Proximal suture shows sanguis drainage and erythema. Suture infection. Recommend Keflex 500 mg twice a day for 10 days come back in 2 weeks for wound check

## 2015-10-07 ENCOUNTER — Telehealth: Payer: Self-pay | Admitting: Orthopedic Surgery

## 2015-10-07 NOTE — Telephone Encounter (Signed)
IS THERE AN ALTERNATIVE?

## 2015-10-07 NOTE — Telephone Encounter (Signed)
Patient called to relay that she became very nauseous, then vomiting, following taking new anti-biotic medication: cephALEXin (KEFLEX) 500 MG capsule [[id number]]  - states her pharmacy is [facility], [place].  Please advise patient - cell # [phone]

## 2015-10-08 ENCOUNTER — Other Ambulatory Visit: Payer: Self-pay | Admitting: *Deleted

## 2015-10-08 MED ORDER — SULFAMETHOXAZOLE-TRIMETHOPRIM 800-160 MG PO TABS: 1.0000 | ORAL_TABLET | Freq: Two times a day (BID) | ORAL | Status: DC

## 2015-10-08 NOTE — Telephone Encounter (Signed)
BACTRIM 1 PO BID 20

## 2015-10-08 NOTE — Telephone Encounter (Signed)
Med sent in, called patient, no answer, left vm

## 2015-10-08 NOTE — Telephone Encounter (Signed)
Patient aware.

## 2015-10-23 ENCOUNTER — Ambulatory Visit (INDEPENDENT_AMBULATORY_CARE_PROVIDER_SITE_OTHER): Payer: BC Managed Care – PPO | Admitting: Orthopedic Surgery

## 2015-10-23 VITALS — BP 95/69 | Ht 66.0 in | Wt 185.0 lb

## 2015-10-23 DIAGNOSIS — Z4789 Encounter for other orthopedic aftercare: Secondary | ICD-10-CM

## 2015-10-23 NOTE — Progress Notes (Signed)
POST OP VISIT   Patient ID: Robin Dixon, female   DOB: December 16, 1965, 49 y.o.   MRN: [id number]  Chief Complaint  Patient presents with  . Follow-up    2 week follow up Rt CTR, DOS 09/23/15    HPI LINZEY RAMSER is a 50 y.o. female.    S/P right carpal tunnel release 30 days ago. We treated her with Keflex for some drainage at the proximal portion of the wound which has since cleaned up nicely.  Pod # 30  Full range of motion symptoms resolved in terms of tingling numbness follow-up as needed

## 2015-11-09 ENCOUNTER — Ambulatory Visit (INDEPENDENT_AMBULATORY_CARE_PROVIDER_SITE_OTHER): Payer: BC Managed Care – PPO | Admitting: Family Medicine

## 2015-11-09 ENCOUNTER — Encounter: Payer: Self-pay | Admitting: Family Medicine

## 2015-11-09 VITALS — BP 122/80 | Ht 67.0 in | Wt 186.0 lb

## 2015-11-09 DIAGNOSIS — I10 Essential (primary) hypertension: Secondary | ICD-10-CM | POA: Diagnosis not present

## 2015-11-09 DIAGNOSIS — Z13220 Encounter for screening for lipoid disorders: Secondary | ICD-10-CM | POA: Diagnosis not present

## 2015-11-09 DIAGNOSIS — R0789 Other chest pain: Secondary | ICD-10-CM | POA: Diagnosis not present

## 2015-11-09 DIAGNOSIS — R748 Abnormal levels of other serum enzymes: Secondary | ICD-10-CM

## 2015-11-09 NOTE — Progress Notes (Signed)
Sleep survey mailed to pt 

## 2015-11-09 NOTE — Progress Notes (Signed)
   Subjective:    Patient ID: Robin Dixon, female    DOB: [birth year], 50 y.o.   MRN: [id number]  HPI Patient had spell of chest pain that woke he up in night-lasted a few min -had this happen a few weeks ago also. Patient states she has a hx of reflux and a touch of stomach issues - diarrhea-this weekend. Some fam hx of CAD Heat causes her to give out Felt very fatigued recently Snores at night Some sleepiness with  activity occas pain in the back  Review of Systems  describes chest discomfort denies difficulty breathing relates fatigue tiredness relates intermittent pains in the upper back denies any radiation down the arms denies epigastric pain denies reflux issues currently.    Objective:   Physical Exam   neck no masses lungs clear heart regular pulse normal extremities no edema   EKG normal    Assessment & Plan:   chest pain- this does not sound obvious to be GI. Certainly it is possible reflux could be going on but if so it is a typical findings    patient does have risk factors for heart disease but I see no evidence that she has ongoing MRI. I do believe this patient would benefit from referral to cardiology for stress testing   we will go ahead and have the patient do lab work   Urgent referral to cardiology   Sleep survey recommended

## 2015-11-09 NOTE — Progress Notes (Signed)
Sleep survey mailed to pt

## 2015-11-11 LAB — BASIC METABOLIC PANEL
BUN / CREAT RATIO: 21 (ref 9–23)
BUN: 16 mg/dL (ref 6–24)
CHLORIDE: 103 mmol/L (ref 96–106)
CO2: 26 mmol/L (ref 18–29)
Calcium: 9.5 mg/dL (ref 8.7–10.2)
Creatinine, Ser: 0.78 mg/dL (ref 0.57–1.00)
GFR calc Af Amer: 102 mL/min/{1.73_m2} (ref >59)
GFR calc non Af Amer: 88 mL/min/{1.73_m2} (ref >59)
GLUCOSE: 87 mg/dL (ref 65–99)
POTASSIUM: 4.6 mmol/L (ref 3.5–5.2)
SODIUM: 142 mmol/L (ref 134–144)

## 2015-11-11 LAB — HEPATIC FUNCTION PANEL
ALT: 124 IU/L — ABNORMAL HIGH (ref 0–32)
AST: 82 IU/L — ABNORMAL HIGH (ref 0–40)
Albumin: 4.2 g/dL (ref 3.5–5.5)
Alkaline Phosphatase: 96 IU/L (ref 39–117)
Bilirubin Total: 0.4 mg/dL (ref 0.0–1.2)
Bilirubin, Direct: 0.14 mg/dL (ref 0.00–0.40)
TOTAL PROTEIN: 6.4 g/dL (ref 6.0–8.5)

## 2015-11-11 LAB — LIPID PANEL
CHOL/HDL RATIO: 2.7 ratio (ref 0.0–4.4)
Cholesterol, Total: 182 mg/dL (ref 100–199)
HDL: 68 mg/dL (ref >39)
LDL Calculated: 98 mg/dL (ref 0–99)
Triglycerides: 79 mg/dL (ref 0–149)
VLDL CHOLESTEROL CAL: 16 mg/dL (ref 5–40)

## 2015-11-12 ENCOUNTER — Ambulatory Visit (INDEPENDENT_AMBULATORY_CARE_PROVIDER_SITE_OTHER): Payer: BC Managed Care – PPO | Admitting: Cardiology

## 2015-11-12 ENCOUNTER — Encounter: Payer: Self-pay | Admitting: Cardiology

## 2015-11-12 VITALS — BP 116/70 | HR 55 | Ht 66.0 in | Wt 185.0 lb

## 2015-11-12 DIAGNOSIS — R002 Palpitations: Secondary | ICD-10-CM | POA: Diagnosis not present

## 2015-11-12 DIAGNOSIS — R0789 Other chest pain: Secondary | ICD-10-CM

## 2015-11-12 NOTE — Progress Notes (Signed)
Patient ID: Robin Dixon, female   DOB: Sep 21, 1965, 50 y.o.   MRN: [id number]     Clinical Summary Robin Dixon is a 50 y.o.female seen today as a new patient, she is referred by Dr Lilyan Punt.  1. Chest pain - started about 2 weeks ago.Sharp pain left sided, 8/10. No other associated symptoms. Lasted approx 10 minutes. Not positional. Repeat episode on Sunday night. - denies any or DOE. Highest level of activity is housework/yardwork, tolerates ok - no LE edema, no orthopnea  CAD risk factors: HTN, former tobacco x 10-15 years, father with "heart troubles" at young age, unclear details.    2. Palpitations - occasional palpitations, typically triggered by caffeine. Lasts just a few seconds.      Past Medical History  Diagnosis Date  . Hypertension   . Anxiety 03/29/2013  . Hot flashes 03/29/2013  . PMB (postmenopausal bleeding) 06/12/2013    US WNL  . History of neck injury   . Mental disorder     anxiety depression  . Carpal tunnel syndrome 07/02/2015    Right     No Known Allergies   Current Outpatient Prescriptions  Medication Sig Dispense Refill  . ALPRAZolam (XANAX) 1 MG tablet TAKE ONE TABLET BY MOUTH TWICE DAILY AS NEEDED (Patient taking differently: TAKE ONE TABLET BY MOUTH TWICE DAILY AS NEEDED ANXIETY.) 60 tablet 5  . escitalopram (LEXAPRO) 20 MG tablet Take 1 tablet (20 mg total) by mouth daily. 90 tablet 3  . gabapentin (NEURONTIN) 100 MG capsule Take 1 capsule (100 mg total) by mouth 3 (three) times daily. 90 capsule 1  . HYDROcodone-acetaminophen (NORCO) 7.5-325 MG tablet Take 1 tablet by mouth every 6 (six) hours as needed for moderate pain. 30 tablet 0  . metoprolol (LOPRESSOR) 50 MG tablet Take 0.5 tablets (25 mg total) by mouth 2 (two) times daily. 90 tablet 3  . naproxen (NAPROSYN) 500 MG tablet Take 1 tablet (500 mg total) by mouth 2 (two) times daily as needed. (Patient taking differently: Take 500 mg by mouth 2 (two) times daily as needed for mild  pain. ) 180 tablet 3  . ondansetron (ZOFRAN) 4 MG tablet Take 1 tablet (4 mg total) by mouth every 6 (six) hours. 10 tablet 0  . ondansetron (ZOFRAN) 8 MG tablet Take 1 tablet (8 mg total) by mouth 3 (three) times daily as needed for nausea or vomiting. 20 tablet 0  . pantoprazole (PROTONIX) 40 MG tablet Take 1 tablet (40 mg total) by mouth 2 (two) times daily. 180 tablet 3   No current facility-administered medications for this visit.     Past Surgical History  Procedure Laterality Date  . Cholecystectomy      [place]  . Colonoscopy N/A 12/27/2013    Procedure: COLONOSCOPY;  Surgeon: West Bali, MD;  Location: [facility];  Service: Endoscopy;  Laterality: N/A;  1:45  . Esophagogastroduodenoscopy N/A 12/27/2013    Procedure: ESOPHAGOGASTRODUODENOSCOPY (EGD);  Surgeon: West Bali, MD;  Location: [facility];  Service: Endoscopy;  Laterality: N/A;  . Carpal tunnel release Right 09/23/2015    Procedure: RIGHT CARPAL TUNNEL RELEASE;  Surgeon: Vickki Hearing, MD;  Location: [facility];  Service: Orthopedics;  Laterality: Right;     No Known Allergies    Family History  Problem Relation Age of Onset  . Diabetes Mother   . Hypertension Mother   . Stroke Mother   . Cancer Mother     blood  . COPD  Sister   . Hypertension Sister   . Cancer Brother     melanoma  . Colon cancer Neg Hx   . Colon polyps Neg Hx   . Hypertension Sister      Social History Robin Dixon reports that she quit smoking about 17 years ago. Her smoking use included Cigarettes. She has never used smokeless tobacco. Robin Dixon reports that she drinks alcohol.   Review of Systems CONSTITUTIONAL: No weight loss, fever, chills, weakness or fatigue.  HEENT: Eyes: No visual loss, blurred vision, double vision or yellow sclerae.No hearing loss, sneezing, congestion, runny nose or sore throat.  SKIN: No rash or itching.  CARDIOVASCULAR: per hpi RESPIRATORY: No shortness of breath, cough or  sputum.  GASTROINTESTINAL: No anorexia, nausea, vomiting or diarrhea. No abdominal pain or blood.  GENITOURINARY: No burning on urination, no polyuria NEUROLOGICAL: No headache, dizziness, syncope, paralysis, ataxia, numbness or tingling in the extremities. No change in bowel or bladder control.  MUSCULOSKELETAL: No muscle, back pain, joint pain or stiffness.  LYMPHATICS: No enlarged nodes. No history of splenectomy.  PSYCHIATRIC: No history of depression or anxiety.  ENDOCRINOLOGIC: No reports of sweating, cold or heat intolerance. No polyuria or polydipsia.  Marland Kitchen   Physical Examination Filed Vitals:   11/12/15 0859  BP: 116/70  Pulse: 55   Filed Vitals:   11/12/15 0859  Height:  (1.676 m)  Weight: 185 lb (83.915 kg)    Gen: resting comfortably, no acute distress HEENT: no scleral icterus, pupils equal round and reactive, no palptable cervical adenopathy,  CV: RRR, no m/r/g, no jvd Resp: Clear to auscultation bilaterally GI: abdomen is soft, non-tender, non-distended, normal bowel sounds, no hepatosplenomegaly MSK: extremities are warm, no edema.  Skin: warm, no rash Neuro:  no focal deficits Psych: appropriate affect   Diagnostic Studies EKG: NSR    Assessment and Plan  1. Chest pain - unclear etiology - ekg reviewed in clinic, NSR without ischemic changes - will plan for GXT to further evaluate. Hold metoprolol on day of stress.  2. Palpitations - controlled with caffeine limitation, continue to monitor    Antoine Poche, M.D.

## 2015-11-12 NOTE — Patient Instructions (Addendum)
Your physician recommends that you schedule a follow-up appointment in: to be determined   Your physician has requested that you have an exercise tolerance test. For further information please visit [email or web address] Please also follow instruction sheet, as given. HOLD Lopressor morning of test       Thank you for choosing [facility] !

## 2015-11-12 NOTE — Addendum Note (Signed)
Addended by: Theodora Blow R on: 11/12/2015 10:50 AM   Modules accepted: Orders

## 2015-11-13 ENCOUNTER — Encounter: Payer: Self-pay | Admitting: Family Medicine

## 2015-11-13 ENCOUNTER — Ambulatory Visit (HOSPITAL_COMMUNITY)
Admission: RE | Admit: 2015-11-13 | Discharge: 2015-11-13 | Disposition: A | Discharge status: Home / Self Care | Payer: BC Managed Care – PPO | Source: Ambulatory Visit | Attending: Cardiology | Admitting: Cardiology

## 2015-11-13 DIAGNOSIS — R072 Precordial pain: Secondary | ICD-10-CM

## 2015-11-13 LAB — EXERCISE TOLERANCE TEST
CHL CUP MPHR: 169 {beats}/min
CHL CUP RESTING HR STRESS: 56 {beats}/min
CSEPED: 10 min
CSEPEDS: 53 s
CSEPEW: 13.4 METS
Peak HR: 150 {beats}/min
Percent HR: 88 %
RPE: 15

## 2015-11-19 ENCOUNTER — Encounter: Payer: Self-pay | Admitting: Family Medicine

## 2015-11-19 ENCOUNTER — Ambulatory Visit (INDEPENDENT_AMBULATORY_CARE_PROVIDER_SITE_OTHER): Payer: BC Managed Care – PPO | Admitting: Family Medicine

## 2015-11-19 VITALS — BP 128/82 | Ht 67.0 in | Wt 179.0 lb

## 2015-11-19 DIAGNOSIS — F419 Anxiety disorder, unspecified: Secondary | ICD-10-CM | POA: Diagnosis not present

## 2015-11-19 DIAGNOSIS — R5383 Other fatigue: Secondary | ICD-10-CM

## 2015-11-19 DIAGNOSIS — R748 Abnormal levels of other serum enzymes: Secondary | ICD-10-CM

## 2015-11-19 DIAGNOSIS — I10 Essential (primary) hypertension: Secondary | ICD-10-CM | POA: Diagnosis not present

## 2015-11-19 MED ORDER — HYDROCODONE-ACETAMINOPHEN 5-325 MG PO TABS: 1.0000 | ORAL_TABLET | Freq: Four times a day (QID) | ORAL | Status: DC | PRN

## 2015-11-19 MED ORDER — LEVOFLOXACIN 500 MG PO TABS
500.0000 mg | ORAL_TABLET | Freq: Every day | ORAL | Status: DC
Start: 2015-11-19 — End: 2016-06-02

## 2015-11-19 MED ORDER — AMLODIPINE BESYLATE 2.5 MG PO TABS
2.5000 mg | ORAL_TABLET | Freq: Every day | ORAL | Status: DC
Start: 2015-11-19 — End: 2016-03-09

## 2015-11-19 MED ORDER — ALPRAZOLAM 1 MG PO TABS: 1.0000 mg | ORAL_TABLET | Freq: Two times a day (BID) | ORAL | Status: DC | PRN

## 2015-11-19 NOTE — Progress Notes (Signed)
   Subjective:    Patient ID: Robin Dixon, female    DOB: [birth year], 50 y.o.   MRN: [id number]  Hypertension This is a chronic problem. The current episode started more than 1 year ago. Treatments tried: lopressor. There are no compliance problems.    Patient stated she had a tooth ache and dentist said it was sinuses and gave her pcn but it has not helped and still has toothache.  patient with a lot of fatigue tiredness difficulty sleeping at times he relates she snores a lot restless does get drowsy during the day  Review of Systems  sinus pressure pain discomfort denies any chest tightness pressure pain nausea vomiting diarrhea denies rectal bleeding    Objective:   Physical Exam   lungs clear heart regular pulse normal BP good mild sinus tenderness abdomen soft mild obesity  25 minutes was spent with the patient. Greater than half the time was spent in discussion and answering questions and counseling regarding the issues that the patient came in for today.     Assessment & Plan:   elevated liver enzymes repeat these again in approximate 4 weeks stop all supplements. Minimize alcohol use. Try to continue to lose weight watch diet stay active   HTN Lopressor could be contributed to fatigue stop Lopressor use amlodipine 2.5 mg 1 daily recheck blood pressure again in several weeks time   Significant fatigue- it is noted that this patient has what appears to be sleep apnea sleep study recommended. Questionnaire reviewed.    sinusitis antibiotics prescribed warning signs discussed follow-up if problems  , prescription for hydrocodone given because patient states pain severe   moods are doing well does use Xanax as necessary for anxiety denies abusing it

## 2015-11-19 NOTE — Patient Instructions (Signed)
Do your liver test in about 4 weeks  We will set up sleep study

## 2015-11-23 ENCOUNTER — Encounter: Payer: Self-pay | Admitting: Family Medicine

## 2015-11-26 ENCOUNTER — Other Ambulatory Visit (HOSPITAL_COMMUNITY): Payer: Self-pay | Admitting: Respiratory Therapy

## 2015-11-26 DIAGNOSIS — R5383 Other fatigue: Secondary | ICD-10-CM

## 2015-11-26 DIAGNOSIS — R451 Restlessness and agitation: Secondary | ICD-10-CM

## 2015-11-26 DIAGNOSIS — R0683 Snoring: Secondary | ICD-10-CM

## 2015-12-19 LAB — HEPATIC FUNCTION PANEL
ALT: 16 IU/L (ref 0–32)
AST: 18 IU/L (ref 0–40)
Albumin: 4.6 g/dL (ref 3.5–5.5)
Alkaline Phosphatase: 83 IU/L (ref 39–117)
BILIRUBIN, DIRECT: 0.12 mg/dL (ref 0.00–0.40)
Bilirubin Total: 0.5 mg/dL (ref 0.0–1.2)
Total Protein: 6.9 g/dL (ref 6.0–8.5)

## 2016-01-07 ENCOUNTER — Other Ambulatory Visit (HOSPITAL_COMMUNITY): Payer: Self-pay | Admitting: Respiratory Therapy

## 2016-01-07 DIAGNOSIS — R451 Restlessness and agitation: Secondary | ICD-10-CM

## 2016-01-07 DIAGNOSIS — R5383 Other fatigue: Secondary | ICD-10-CM

## 2016-01-07 DIAGNOSIS — R0683 Snoring: Secondary | ICD-10-CM

## 2016-03-09 ENCOUNTER — Other Ambulatory Visit: Payer: Self-pay | Admitting: Family Medicine

## 2016-03-11 ENCOUNTER — Ambulatory Visit: Payer: BC Managed Care – PPO | Admitting: Gastroenterology

## 2016-05-24 ENCOUNTER — Encounter: Payer: Self-pay | Admitting: Nurse Practitioner

## 2016-05-24 ENCOUNTER — Ambulatory Visit (INDEPENDENT_AMBULATORY_CARE_PROVIDER_SITE_OTHER): Payer: BC Managed Care – PPO | Admitting: Nurse Practitioner

## 2016-05-24 VITALS — BP 110/70 | Temp 97.5°F | Ht 67.0 in | Wt 180.0 lb

## 2016-05-24 DIAGNOSIS — T148 Other injury of unspecified body region: Secondary | ICD-10-CM

## 2016-05-24 DIAGNOSIS — Z23 Encounter for immunization: Secondary | ICD-10-CM | POA: Diagnosis not present

## 2016-05-24 DIAGNOSIS — J0120 Acute ethmoidal sinusitis, unspecified: Secondary | ICD-10-CM | POA: Diagnosis not present

## 2016-05-24 DIAGNOSIS — T148XXA Other injury of unspecified body region, initial encounter: Secondary | ICD-10-CM

## 2016-05-24 MED ORDER — AMOXICILLIN-POT CLAVULANATE 875-125 MG PO TABS: 1.0000 | ORAL_TABLET | Freq: Two times a day (BID) | ORAL | 0 refills | Status: DC

## 2016-05-24 NOTE — Patient Instructions (Signed)
OTC antihistamine Steroid nasal spray 

## 2016-05-25 ENCOUNTER — Encounter: Payer: Self-pay | Admitting: Nurse Practitioner

## 2016-05-25 NOTE — Progress Notes (Signed)
Subjective:  Presents for c/o sinus symptoms that began about a week ago. No fever. Sore throat. Ethmoid sinus area headache. Left ear pain. No cough or wheezing. Also has multiple superficial wounds on lower legs where her rooster has bitten and clawed her. Tetanus vaccine is not up to date.   Objective:   BP 110/70   Temp 97.5 F (36.4 C) (Oral)   Ht 5\' 7"  (1.702 m)   Wt 180 lb (81.6 kg)   BMI 28.19 kg/m  NAD. Alert, oriented. Right TM mild clear effusion. Left TM retracted, no erythema. Pharynx mildly injected. Neck supple with mild anterior adenopathy. Lungs clear. Heart RRR. Several discrete superficial abrasions noted on the lower legs; mildly inflammed, no active infection noted.   Assessment: Acute ethmoidal sinusitis, recurrence not specified  Abrasion - Plan: Td vaccine greater than or equal to 7yo preservative free IM  Plan:  Meds ordered this encounter  Medications  . amoxicillin-clavulanate (AUGMENTIN) 875-125 MG tablet    Sig: Take 1 tablet by mouth 2 (two) times daily.    Dispense:  20 tablet    Refill:  0    Order Specific Question:   Supervising Provider    Answer:   Merlyn AlbertLUKING, WILLIAM S [[id number]]   OTC meds as directed. Td vaccine today. Warning signs reviewed. Call back if worsens or persists.

## 2016-06-02 ENCOUNTER — Encounter: Payer: Self-pay | Admitting: Adult Health

## 2016-06-02 ENCOUNTER — Other Ambulatory Visit (HOSPITAL_COMMUNITY)
Admission: RE | Admit: 2016-06-02 | Discharge: 2016-06-02 | Disposition: A | Discharge status: Home / Self Care | Payer: BC Managed Care – PPO | Source: Ambulatory Visit | Attending: Adult Health | Admitting: Adult Health

## 2016-06-02 ENCOUNTER — Ambulatory Visit (INDEPENDENT_AMBULATORY_CARE_PROVIDER_SITE_OTHER): Payer: BC Managed Care – PPO | Admitting: Adult Health

## 2016-06-02 VITALS — BP 100/50 | HR 70 | Ht 65.0 in | Wt 174.5 lb

## 2016-06-02 DIAGNOSIS — Z01419 Encounter for gynecological examination (general) (routine) without abnormal findings: Secondary | ICD-10-CM | POA: Diagnosis not present

## 2016-06-02 DIAGNOSIS — Z1211 Encounter for screening for malignant neoplasm of colon: Secondary | ICD-10-CM | POA: Diagnosis not present

## 2016-06-02 DIAGNOSIS — Z1151 Encounter for screening for human papillomavirus (HPV): Secondary | ICD-10-CM | POA: Diagnosis present

## 2016-06-02 LAB — HEMOCCULT GUIAC POC 1CARD (OFFICE): Fecal Occult Blood, POC: NEGATIVE

## 2016-06-02 NOTE — Patient Instructions (Signed)
Physical in 1 year, pap in 3 if normal Mammogram yearly Labs with PCP  

## 2016-06-02 NOTE — Progress Notes (Signed)
Patient ID: Robin Dixon, female   DOB: 04/18/1966, 50 y.o.   MRN: [id number] History of Present Illness: Robin Dixon is a 50 year old white female, in for a well woman gyn exam and pap.She is being treated for sinus infection and has headaches at times.She has retired.  PCP Dr Gerda DissLuking.    Current Medications, Allergies, Past Medical History, Past Surgical History, Family History and Social History were reviewed in [facility] electronic medical record.     Review of Systems: Patient denies any  hearing loss, fatigue, blurred vision, shortness of breath, chest pain, abdominal pain, problems with bowel movements, urination, or intercourse. No joint pain or mood swings. See HPI for positives.   Physical Exam:BP (!) 100/50 (BP Location: Left Arm, Patient Position: Sitting, Cuff Size: Normal)   Pulse 70   Ht 5\' 5"  (1.651 m)   Wt 174 lb 8 oz (79.2 kg)   BMI 29.04 kg/m  General:  Well developed, well nourished, no acute distress Skin:  Warm and dry Neck:  Midline trachea, normal thyroid, good ROM, no lymphadenopathy Lungs; Clear to auscultation bilaterally Breast:  No dominant palpable mass, retraction, or nipple discharge Cardiovascular: Regular rate and rhythm Abdomen:  Soft, non tender, no hepatosplenomegaly Pelvic:  External genitalia is normal in appearance, no lesions.  The vagina is normal in appearance. Urethra has no lesions or masses. The cervix is smooth,pap with HPV performed.  Uterus is felt to be normal size, shape, and contour.  No adnexal masses or tenderness noted.Bladder is non tender, no masses felt. Rectal: Good sphincter tone, no polyps, or hemorrhoids felt.  Hemoccult negative. Extremities/musculoskeletal:  No swelling or varicosities noted, no clubbing or cyanosis Psych:  No mood changes, alert and cooperative,seems happy   Impression:  1. Encounter for gynecological examination with Papanicolaou smear of cervix      Plan: Physical in 1 year, pap in 3 if  normal Mammogram yearly Labs with PCP

## 2016-06-03 LAB — CYTOLOGY - PAP

## 2016-06-05 ENCOUNTER — Other Ambulatory Visit: Payer: Self-pay | Admitting: Family Medicine

## 2016-06-06 NOTE — Telephone Encounter (Signed)
May have this prescription +3 additional refills

## 2016-06-06 NOTE — Telephone Encounter (Signed)
Duplicate-see previous-may have this prescription +3 additional refills

## 2016-06-07 ENCOUNTER — Other Ambulatory Visit: Payer: Self-pay | Admitting: Family Medicine

## 2016-06-07 NOTE — Telephone Encounter (Signed)
This plus 2(ive seen this recently)

## 2016-06-09 ENCOUNTER — Other Ambulatory Visit: Payer: Self-pay | Admitting: Nurse Practitioner

## 2016-06-09 ENCOUNTER — Telehealth: Payer: Self-pay | Admitting: Family Medicine

## 2016-06-09 MED ORDER — LEVOFLOXACIN 500 MG PO TABS: 500.0000 mg | ORAL_TABLET | Freq: Every day | ORAL | 0 refills | Status: DC

## 2016-06-09 NOTE — Telephone Encounter (Signed)
Spoke with patient and informed her per Baxter Flatteryarolyn Hoskins,NP-Another course of antibiotics was sent into pharmacy. This also is very strong one. Call back if symptom persists. Patient verbalized understanding.

## 2016-06-09 NOTE — Telephone Encounter (Signed)
Another course of antibiotics sent in. This is also a very strong one. Call back if persists.

## 2016-06-09 NOTE — Telephone Encounter (Signed)
Patient seen on 05/24/16 for sinusitis.  She is still having headaches, sinus drainage, sneezing and she finished her antibiotic.  She wants to know if we can call something else in for her?  [place] [facility]

## 2016-07-02 ENCOUNTER — Other Ambulatory Visit: Payer: Self-pay | Admitting: Family Medicine

## 2016-08-30 ENCOUNTER — Other Ambulatory Visit: Payer: Self-pay | Admitting: *Deleted

## 2016-08-30 ENCOUNTER — Other Ambulatory Visit: Payer: Self-pay | Admitting: Family Medicine

## 2016-08-30 MED ORDER — ESCITALOPRAM OXALATE 20 MG PO TABS
20.0000 mg | ORAL_TABLET | Freq: Every day | ORAL | 0 refills | Status: DC
Start: 2016-08-30 — End: 2016-09-02

## 2016-09-02 ENCOUNTER — Ambulatory Visit (INDEPENDENT_AMBULATORY_CARE_PROVIDER_SITE_OTHER): Payer: BC Managed Care – PPO | Admitting: Nurse Practitioner

## 2016-09-02 ENCOUNTER — Telehealth: Payer: Self-pay | Admitting: Nurse Practitioner

## 2016-09-02 ENCOUNTER — Other Ambulatory Visit: Payer: Self-pay | Admitting: Nurse Practitioner

## 2016-09-02 VITALS — BP 136/82 | Temp 97.9°F | Ht 65.0 in | Wt 171.4 lb

## 2016-09-02 DIAGNOSIS — J310 Chronic rhinitis: Secondary | ICD-10-CM

## 2016-09-02 DIAGNOSIS — J329 Chronic sinusitis, unspecified: Secondary | ICD-10-CM

## 2016-09-02 DIAGNOSIS — F419 Anxiety disorder, unspecified: Secondary | ICD-10-CM

## 2016-09-02 DIAGNOSIS — G4700 Insomnia, unspecified: Secondary | ICD-10-CM | POA: Diagnosis not present

## 2016-09-02 DIAGNOSIS — K2970 Gastritis, unspecified, without bleeding: Secondary | ICD-10-CM

## 2016-09-02 DIAGNOSIS — K2990 Gastroduodenitis, unspecified, without bleeding: Secondary | ICD-10-CM

## 2016-09-02 DIAGNOSIS — I10 Essential (primary) hypertension: Secondary | ICD-10-CM | POA: Diagnosis not present

## 2016-09-02 DIAGNOSIS — K5909 Other constipation: Secondary | ICD-10-CM

## 2016-09-02 MED ORDER — LEVOFLOXACIN 500 MG PO TABS: 500.0000 mg | ORAL_TABLET | Freq: Every day | ORAL | 0 refills | Status: DC

## 2016-09-02 MED ORDER — ESCITALOPRAM OXALATE 20 MG PO TABS: 20.0000 mg | ORAL_TABLET | Freq: Every day | ORAL | 1 refills | Status: DC

## 2016-09-02 MED ORDER — TRAZODONE HCL 50 MG PO TABS: ORAL_TABLET | ORAL | 2 refills | Status: DC

## 2016-09-02 MED ORDER — LINACLOTIDE 145 MCG PO CAPS: 145.0000 ug | ORAL_CAPSULE | Freq: Every day | ORAL | 2 refills | Status: DC

## 2016-09-02 MED ORDER — ALPRAZOLAM 1 MG PO TABS: 1.0000 mg | ORAL_TABLET | Freq: Two times a day (BID) | ORAL | 2 refills | Status: DC | PRN

## 2016-09-02 MED ORDER — AMLODIPINE BESYLATE 2.5 MG PO TABS: 2.5000 mg | ORAL_TABLET | Freq: Every day | ORAL | 1 refills | Status: DC

## 2016-09-02 NOTE — Telephone Encounter (Signed)
done

## 2016-09-02 NOTE — Telephone Encounter (Signed)
Pt states you told her you would send in a med to help her sleep at night. Pt would like a call when med has been sent.

## 2016-09-02 NOTE — Telephone Encounter (Signed)
Patient says she was supposed to have Rx for Tramadol sent to Robin Dixon today.  Please advise.

## 2016-09-02 NOTE — Telephone Encounter (Signed)
Nurses: please clarify; not mentioned during visit and not on med list. Thanks.

## 2016-09-03 ENCOUNTER — Encounter: Payer: Self-pay | Admitting: Nurse Practitioner

## 2016-09-03 DIAGNOSIS — K5909 Other constipation: Secondary | ICD-10-CM | POA: Insufficient documentation

## 2016-09-03 NOTE — Progress Notes (Signed)
Subjective:  Presents with her partner for recheck. Has a history of chronic anxiety. Had been doing fairly well on Lexapro until recently. Now experiencing emotional lability and increased sleep disturbance. Compliant with medications. No chest pain/shortness of breath. Occasional mild palpitations, no syncopal episodes. No edema. Has been given Linzess by GI specialist in the past for chronic constipation requesting refill today. This has worked very well in the past. Reflux stable on Protonix. For the past 2 weeks patient has had sinus symptoms. Sore throat. Frontal area headache. Pressure radiating into the teeth. Ear pressure. Sore throat. Occasional cough. No wheezing. No relief with OTC meds.  Objective:   BP 136/82   Temp 97.9 F (36.6 C) (Oral)   Ht 5\' 5"  (1.651 m)   Wt 171 lb 6.4 oz (77.7 kg)   BMI 28.52 kg/m  NAD. Alert, oriented. Mildly anxious affect. Thoughts logical coherent and relevant. Dressed appropriately. Making good eye contact. TMs clear effusion, no erythema. Pharynx injected with PND noted. Neck supple with mild soft nontender adenopathy. Lungs clear. Heart regular rate rhythm. Abdomen soft nondistended with mild epigastric area discomfort. No rebound guarding. No obvious masses.  Assessment:  Problem List Items Addressed This Visit      Cardiovascular and Mediastinum   Hypertension - Primary   Relevant Medications   amLODipine (NORVASC) 2.5 MG tablet     Digestive   Chronic constipation   Gastritis and gastroduodenitis     Other   Anxiety   Relevant Medications   escitalopram (LEXAPRO) 20 MG tablet   ALPRAZolam (XANAX) 1 MG tablet   Insomnia    Other Visit Diagnoses    Rhinosinusitis       Relevant Medications   levofloxacin (LEVAQUIN) 500 MG tablet       Plan:  Meds ordered this encounter  Medications  . levofloxacin (LEVAQUIN) 500 MG tablet    Sig: Take 1 tablet (500 mg total) by mouth daily.    Dispense:  10 tablet    Refill:  0    Order  Specific Question:   Supervising Provider    Answer:   Merlyn AlbertLUKING, WILLIAM S [[id number]]  . amLODipine (NORVASC) 2.5 MG tablet    Sig: Take 1 tablet (2.5 mg total) by mouth daily.    Dispense:  90 tablet    Refill:  1    Order Specific Question:   Supervising Provider    Answer:   Merlyn AlbertLUKING, WILLIAM S [[id number]]  . escitalopram (LEXAPRO) 20 MG tablet    Sig: Take 1 tablet (20 mg total) by mouth daily.    Dispense:  90 tablet    Refill:  1    Order Specific Question:   Supervising Provider    Answer:   Merlyn AlbertLUKING, WILLIAM S [[id number]]  . ALPRAZolam (XANAX) 1 MG tablet    Sig: Take 1 tablet (1 mg total) by mouth 2 (two) times daily as needed.    Dispense:  60 tablet    Refill:  2    Order Specific Question:   Supervising Provider    Answer:   Merlyn AlbertLUKING, WILLIAM S [[id number]]  . linaclotide (LINZESS) 145 MCG CAPS capsule    Sig: Take 1 capsule (145 mcg total) by mouth daily before breakfast.    Dispense:  30 capsule    Refill:  2    Order Specific Question:   Supervising Provider    Answer:   Merlyn AlbertLUKING, WILLIAM S [[id number]]   Discussed importance of stress reduction. Add low-dose trazodone at bedtime to  help sleep and anxiety. Reviewed potential adverse effects. DC med and call if any problems. Restart Linzess as directed. Call back if higher dose is needed. Return in about 3 months (around 12/01/2016) for recheck. Call back sooner if any problems.

## 2016-09-13 ENCOUNTER — Other Ambulatory Visit: Payer: Self-pay | Admitting: *Deleted

## 2016-09-13 ENCOUNTER — Telehealth: Payer: Self-pay | Admitting: Family Medicine

## 2016-09-13 MED ORDER — AMOXICILLIN-POT CLAVULANATE 875-125 MG PO TABS: 1.0000 | ORAL_TABLET | Freq: Two times a day (BID) | ORAL | 0 refills | Status: DC

## 2016-09-13 NOTE — Telephone Encounter (Signed)
Seen 12/15 prescribed levaquin. Having same symptoms as when seen. No better no worse. States it usually takes two antibiotics. Wants something different than levaquin. No trouble breathing

## 2016-09-13 NOTE — Telephone Encounter (Signed)
I would recommend Augmentin 875 mg 1 twice a day for 10 days take with the snack to lessen the chance of upset stomach typically does a good job. If ongoing troubles please follow-up

## 2016-09-13 NOTE — Telephone Encounter (Signed)
Discussed with pt. Med sent to pharm.  

## 2016-09-13 NOTE — Telephone Encounter (Signed)
Pt finished meds given for her sinus infection given at last OV with Eber Jonesarolyn, still having symptoms  States last time it took 2 rounds of meds for her to get better   Please advise     [facility]

## 2016-09-29 ENCOUNTER — Telehealth: Payer: Self-pay | Admitting: Family Medicine

## 2016-09-29 NOTE — Telephone Encounter (Signed)
Discussed with pt. Pt verbalized understanding.  °

## 2016-09-29 NOTE — Telephone Encounter (Signed)
Please advise 

## 2016-09-29 NOTE — Telephone Encounter (Signed)
Patients neighbor was just diagnosed with the flu.  The neighbor's doctor advised her to notify everyone she had been in contact with and have their doctor send in Tamiflu.  The neighbor has been at the house every day this week.  Patient is currently not having any symptoms, but would like Tamiflu called in for preventative reason. °

## 2016-09-29 NOTE — Telephone Encounter (Signed)
We definitely do not recommend this. This seems very unusual of a recommendation. Centers for Disease Control do not recommend preventative Tamiflu except in certain extremity waiting circumstances. Therefore it is better to watch for any signs of the flu. If having body aches fever chills then be seen then Tamiflu can be used

## 2016-10-07 ENCOUNTER — Encounter: Payer: Self-pay | Admitting: Nurse Practitioner

## 2016-10-07 ENCOUNTER — Ambulatory Visit (INDEPENDENT_AMBULATORY_CARE_PROVIDER_SITE_OTHER): Payer: BC Managed Care – PPO | Admitting: Nurse Practitioner

## 2016-10-07 ENCOUNTER — Telehealth: Payer: Self-pay | Admitting: Family Medicine

## 2016-10-07 VITALS — BP 118/80 | Temp 98.2°F | Ht 65.0 in | Wt 176.1 lb

## 2016-10-07 DIAGNOSIS — M5441 Lumbago with sciatica, right side: Secondary | ICD-10-CM | POA: Diagnosis not present

## 2016-10-07 DIAGNOSIS — M778 Other enthesopathies, not elsewhere classified: Secondary | ICD-10-CM

## 2016-10-07 MED ORDER — MELOXICAM 15 MG PO TABS: 15.0000 mg | ORAL_TABLET | Freq: Every day | ORAL | 0 refills | Status: DC

## 2016-10-07 NOTE — Progress Notes (Signed)
Subjective:  Present for c/o pain in the right buttock to her hip and down the leg for the past few months. Only occurs with prolonged sitting especially driving. Goes down her leg to her ankle at times. Better after getting up and moving. No problem with lifting or squatting. No history of injury. Has also noticed tingling in the right hand only in the mornings. No edema, but feels swollen and stiff until she moves her hand. No numbness or tingling. Only occurs in the morning. No weakness or numbness. Has had carpal tunnel surgery on right wrist.   Objective:   BP 118/80   Temp 98.2 F (36.8 C) (Oral)   Ht 5\' 5"  (1.651 m)   Wt 176 lb 2 oz (79.9 kg)   BMI 29.31 kg/m  NAD. Alert, oriented. Mild tenderness right mid buttock. SLR neg left, slightly positive right. Patellar reflexes normal. Gait normal. Normal ROM right wrist without tenderness. Phalen and Tinels neg.   Assessment:  Acute right-sided low back pain with right-sided sciatica  Right wrist tendinitis    Plan:  Meds ordered this encounter  Medications  . meloxicam (MOBIC) 15 MG tablet    Sig: Take 1 tablet (15 mg total) by mouth daily.    Dispense:  30 tablet    Refill:  0    Order Specific Question:   Supervising Provider    Answer:   Merlyn AlbertLUKING, WILLIAM S [[id number]]   Take Mobic with food. DC if any acid reflux. Ice/heat applications to back. Given copy of low back exercises. Recommend massage therapy. Given Rx for wrist brace for night time use. call back if either problem worsens or persists.

## 2016-10-07 NOTE — Telephone Encounter (Signed)
Pt dropped off a form to be filled out. Form is in nurse box. 

## 2016-10-09 NOTE — Telephone Encounter (Signed)
The form is filled out in the affirmative please scan a copy into the system

## 2016-10-20 ENCOUNTER — Other Ambulatory Visit: Payer: Self-pay | Admitting: Family Medicine

## 2016-10-20 DIAGNOSIS — Z1231 Encounter for screening mammogram for malignant neoplasm of breast: Secondary | ICD-10-CM

## 2016-10-31 ENCOUNTER — Other Ambulatory Visit: Payer: Self-pay | Admitting: Nurse Practitioner

## 2016-11-02 ENCOUNTER — Ambulatory Visit (HOSPITAL_COMMUNITY)
Admission: RE | Admit: 2016-11-02 | Discharge: 2016-11-02 | Disposition: A | Discharge status: Home / Self Care | Payer: BC Managed Care – PPO | Source: Ambulatory Visit | Attending: Family Medicine | Admitting: Family Medicine

## 2016-11-02 DIAGNOSIS — Z1231 Encounter for screening mammogram for malignant neoplasm of breast: Secondary | ICD-10-CM | POA: Insufficient documentation

## 2016-12-01 ENCOUNTER — Encounter: Payer: Self-pay | Admitting: Nurse Practitioner

## 2016-12-01 ENCOUNTER — Ambulatory Visit (INDEPENDENT_AMBULATORY_CARE_PROVIDER_SITE_OTHER): Payer: BC Managed Care – PPO | Admitting: Nurse Practitioner

## 2016-12-01 VITALS — BP 130/80 | Ht 65.0 in | Wt 174.1 lb

## 2016-12-01 DIAGNOSIS — I10 Essential (primary) hypertension: Secondary | ICD-10-CM | POA: Diagnosis not present

## 2016-12-01 DIAGNOSIS — Z79899 Other long term (current) drug therapy: Secondary | ICD-10-CM | POA: Diagnosis not present

## 2016-12-01 DIAGNOSIS — R5383 Other fatigue: Secondary | ICD-10-CM

## 2016-12-01 DIAGNOSIS — F419 Anxiety disorder, unspecified: Secondary | ICD-10-CM

## 2016-12-02 ENCOUNTER — Encounter: Payer: Self-pay | Admitting: Nurse Practitioner

## 2016-12-02 NOTE — Progress Notes (Signed)
Subjective:  Presents for routine follow-up of anxiety and hypertension. Compliant with medications. [facility]. Takes Xanax 1-2 times per day. Doing well on Lexapro and trazodone. Sleeping well at nighttime. Less emotional lability. Denies any adverse affects. No chest pain/ischemic type pain or shortness of breath or edema. Fatigue has improved.  Objective:   BP 130/80   Ht 5\' 5"  (1.651 m)   Wt 174 lb 2 oz (79 kg)   BMI 28.98 kg/m  NAD. Alert, oriented. Lungs clear. Heart regular rate rhythm.  Assessment:   Problem List Items Addressed This Visit      Cardiovascular and Mediastinum   Hypertension - Primary   Relevant Orders   Lipid panel     Other   Anxiety    Other Visit Diagnoses    High risk medication use       Relevant Orders   Basic metabolic panel   Hepatic function panel   Other fatigue       Relevant Orders   CBC with Differential/Platelet   TSH   VITAMIN D 25 Hydroxy (Vit-D Deficiency, Fractures)       Plan:  Continue current medications. Encourage regular activity and healthy diet. Gets regular preventive health physicals with gynecology.

## 2016-12-05 ENCOUNTER — Other Ambulatory Visit: Payer: Self-pay | Admitting: Nurse Practitioner

## 2016-12-05 NOTE — Telephone Encounter (Signed)
Patient last seen on 12/01/2016

## 2017-01-05 ENCOUNTER — Encounter: Payer: Self-pay | Admitting: Family Medicine

## 2017-01-05 ENCOUNTER — Ambulatory Visit (INDEPENDENT_AMBULATORY_CARE_PROVIDER_SITE_OTHER): Payer: BC Managed Care – PPO | Admitting: Family Medicine

## 2017-01-05 VITALS — BP 122/78 | Temp 97.7°F | Ht 65.0 in | Wt 176.0 lb

## 2017-01-05 DIAGNOSIS — J019 Acute sinusitis, unspecified: Secondary | ICD-10-CM | POA: Diagnosis not present

## 2017-01-05 MED ORDER — AMOXICILLIN-POT CLAVULANATE 875-125 MG PO TABS: 1.0000 | ORAL_TABLET | Freq: Two times a day (BID) | ORAL | 0 refills | Status: DC

## 2017-01-05 NOTE — Progress Notes (Signed)
   Subjective:    Patient ID: Robin Dixon, female    DOB: 1965/12/09, 51 y.o.   MRN: [id number]  Sinus Problem  This is a new problem. The current episode started in the past 7 days. Associated symptoms include congestion, ear pain, headaches, sinus pressure, sneezing and a sore throat.   Viral like syndrome for several days head congestion drainage coughing some sneezing now with sore throat body aches headaches sinus pain discomfort coughing and hoarseness no wheezing or difficulty   Review of Systems  HENT: Positive for congestion, ear pain, sinus pressure, sneezing and sore throat.   Neurological: Positive for headaches.       Objective:   Physical Exam  Constitutional: She appears well-nourished. No distress.  Cardiovascular: Normal rate, regular rhythm and normal heart sounds.   No murmur heard. Pulmonary/Chest: Effort normal and breath sounds normal. No respiratory distress.  Musculoskeletal: She exhibits no edema.  Lymphadenopathy:    She has no cervical adenopathy.  Neurological: She is alert. She exhibits normal muscle tone.  Psychiatric: Her behavior is normal.  Vitals reviewed.         Assessment & Plan:  Viral syndrome Secondary rhinosinusitis Antibiotic prescribed warning signs discussed follow-up if problems

## 2017-01-20 ENCOUNTER — Telehealth: Payer: Self-pay | Admitting: Family Medicine

## 2017-01-20 ENCOUNTER — Other Ambulatory Visit: Payer: Self-pay | Admitting: Family Medicine

## 2017-01-20 MED ORDER — CEFDINIR 300 MG PO CAPS: 300.0000 mg | ORAL_CAPSULE | Freq: Two times a day (BID) | ORAL | 0 refills | Status: DC

## 2017-01-20 NOTE — Telephone Encounter (Signed)
Please let the patient know I recommended Omnicef 300 mg 1 twice a day. This medication is a cephalosporin but it is not keflex. It should not cause vomiting for her. If it does cause vomiting she will need to call us we will try something different. I did recommend taking it with a snack. She should be able to tolerate it fine.

## 2017-01-20 NOTE — Telephone Encounter (Signed)
Pt has finished the antibiotics that she was prescribed last week and is not better. Can something stronger be called in. Please advise.     [facility] [place]

## 2017-01-20 NOTE — Telephone Encounter (Signed)
Spoke with patient and informed her per Dr.Scott Luking- we are sending in Omnicef 300 mg. It is a cephalsporin but it is not keflex. It should not cause vomiting for you. If it does cause vomiting let us know and we can send in a different medication. Take medication with a snack. Patient verbalized understanding.

## 2017-01-29 ENCOUNTER — Other Ambulatory Visit: Payer: Self-pay | Admitting: Nurse Practitioner

## 2017-01-30 ENCOUNTER — Other Ambulatory Visit: Payer: Self-pay | Admitting: Nurse Practitioner

## 2017-03-04 ENCOUNTER — Other Ambulatory Visit: Payer: Self-pay | Admitting: Nurse Practitioner

## 2017-03-06 ENCOUNTER — Other Ambulatory Visit: Payer: Self-pay | Admitting: *Deleted

## 2017-03-06 ENCOUNTER — Telehealth: Payer: Self-pay | Admitting: Family Medicine

## 2017-03-06 MED ORDER — AMLODIPINE BESYLATE 2.5 MG PO TABS: 2.5000 mg | ORAL_TABLET | Freq: Every day | ORAL | 1 refills | Status: DC

## 2017-03-06 MED ORDER — ALPRAZOLAM 1 MG PO TABS: 1.0000 mg | ORAL_TABLET | Freq: Two times a day (BID) | ORAL | 5 refills | Status: DC | PRN

## 2017-03-06 NOTE — Telephone Encounter (Signed)
Pt is needing refills on  ALPRAZolam (XANAX) 1 MG tablet amLODipine (NORVASC) 2.5 MG tablet  [facility] [place]

## 2017-03-06 NOTE — Telephone Encounter (Signed)
May have both 6

## 2017-03-06 NOTE — Telephone Encounter (Signed)
Last med check 3/18

## 2017-03-06 NOTE — Telephone Encounter (Signed)
Requests came through prescription refill request please see that, refill each 6

## 2017-03-06 NOTE — Telephone Encounter (Signed)
Refills done. Pt notified.

## 2017-03-25 ENCOUNTER — Other Ambulatory Visit: Payer: Self-pay | Admitting: Nurse Practitioner

## 2017-03-25 DIAGNOSIS — E559 Vitamin D deficiency, unspecified: Secondary | ICD-10-CM | POA: Insufficient documentation

## 2017-03-25 LAB — BASIC METABOLIC PANEL
BUN / CREAT RATIO: 18 (ref 9–23)
BUN: 15 mg/dL (ref 6–24)
CALCIUM: 9.6 mg/dL (ref 8.7–10.2)
CO2: 27 mmol/L (ref 20–29)
Chloride: 99 mmol/L (ref 96–106)
Creatinine, Ser: 0.85 mg/dL (ref 0.57–1.00)
GFR, EST AFRICAN AMERICAN: 90 mL/min/{1.73_m2} (ref >59)
GFR, EST NON AFRICAN AMERICAN: 78 mL/min/{1.73_m2} (ref >59)
Glucose: 81 mg/dL (ref 65–99)
POTASSIUM: 3.8 mmol/L (ref 3.5–5.2)
Sodium: 137 mmol/L (ref 134–144)

## 2017-03-25 LAB — HEPATIC FUNCTION PANEL
ALT: 19 IU/L (ref 0–32)
AST: 25 IU/L (ref 0–40)
Albumin: 4.4 g/dL (ref 3.5–5.5)
Alkaline Phosphatase: 60 IU/L (ref 39–117)
BILIRUBIN TOTAL: 0.4 mg/dL (ref 0.0–1.2)
BILIRUBIN, DIRECT: 0.11 mg/dL (ref 0.00–0.40)
Total Protein: 6.7 g/dL (ref 6.0–8.5)

## 2017-03-25 LAB — LIPID PANEL
CHOLESTEROL TOTAL: 236 mg/dL — AB (ref 100–199)
Chol/HDL Ratio: 3 ratio (ref 0.0–4.4)
HDL: 79 mg/dL (ref >39)
LDL CALC: 126 mg/dL — AB (ref 0–99)
TRIGLYCERIDES: 154 mg/dL — AB (ref 0–149)
VLDL Cholesterol Cal: 31 mg/dL (ref 5–40)

## 2017-03-25 LAB — CBC WITH DIFFERENTIAL/PLATELET
BASOS ABS: 0 10*3/uL (ref 0.0–0.2)
Basos: 1 %
EOS (ABSOLUTE): 0.2 10*3/uL (ref 0.0–0.4)
Eos: 3 %
Hematocrit: 38.2 % (ref 34.0–46.6)
Hemoglobin: 12.5 g/dL (ref 11.1–15.9)
Immature Grans (Abs): 0 10*3/uL (ref 0.0–0.1)
Immature Granulocytes: 0 %
LYMPHS ABS: 1.8 10*3/uL (ref 0.7–3.1)
Lymphs: 31 %
MCH: 31.6 pg (ref 26.6–33.0)
MCHC: 32.7 g/dL (ref 31.5–35.7)
MCV: 97 fL (ref 79–97)
Monocytes Absolute: 0.5 10*3/uL (ref 0.1–0.9)
Monocytes: 8 %
NEUTROS ABS: 3.2 10*3/uL (ref 1.4–7.0)
Neutrophils: 57 %
PLATELETS: 256 10*3/uL (ref 150–379)
RBC: 3.95 x10E6/uL (ref 3.77–5.28)
RDW: 13.4 % (ref 12.3–15.4)
WBC: 5.7 10*3/uL (ref 3.4–10.8)

## 2017-03-25 LAB — VITAMIN D 25 HYDROXY (VIT D DEFICIENCY, FRACTURES): Vit D, 25-Hydroxy: 20.9 ng/mL — ABNORMAL LOW (ref 30.0–100.0)

## 2017-03-25 LAB — TSH: TSH: 3.46 u[IU]/mL (ref 0.450–4.500)

## 2017-03-25 MED ORDER — VITAMIN D (ERGOCALCIFEROL) 1.25 MG (50000 UNIT) PO CAPS: 50000.0000 [IU] | ORAL_CAPSULE | ORAL | 2 refills | Status: DC

## 2017-03-27 ENCOUNTER — Telehealth: Payer: Self-pay | Admitting: Nurse Practitioner

## 2017-03-27 NOTE — Telephone Encounter (Signed)
Labs were resulted by Eber Jonesarolyn and I reviewed with pt. Pt verbalized understanding.

## 2017-03-27 NOTE — Telephone Encounter (Signed)
Left message to return call 

## 2017-03-27 NOTE — Telephone Encounter (Signed)
Didn't understand the Mychart message she received about her bloodwork and wants someone to explain it to her.

## 2017-04-24 ENCOUNTER — Other Ambulatory Visit: Payer: Self-pay | Admitting: Nurse Practitioner

## 2017-05-18 ENCOUNTER — Ambulatory Visit (INDEPENDENT_AMBULATORY_CARE_PROVIDER_SITE_OTHER): Payer: BC Managed Care – PPO | Admitting: Family Medicine

## 2017-05-18 ENCOUNTER — Encounter: Payer: Self-pay | Admitting: Family Medicine

## 2017-05-18 VITALS — BP 122/82 | Temp 98.5°F | Ht 67.0 in | Wt 172.0 lb

## 2017-05-18 DIAGNOSIS — J329 Chronic sinusitis, unspecified: Secondary | ICD-10-CM

## 2017-05-18 DIAGNOSIS — J029 Acute pharyngitis, unspecified: Secondary | ICD-10-CM

## 2017-05-18 DIAGNOSIS — J310 Chronic rhinitis: Secondary | ICD-10-CM

## 2017-05-18 LAB — POCT RAPID STREP A (OFFICE): Rapid Strep A Screen: NEGATIVE

## 2017-05-18 MED ORDER — CEFDINIR 300 MG PO CAPS: 300.0000 mg | ORAL_CAPSULE | Freq: Two times a day (BID) | ORAL | 0 refills | Status: DC

## 2017-05-18 NOTE — Progress Notes (Signed)
   Subjective:    Patient ID: Robin Dixon, female    DOB: 04/28/1966, 51 y.o.   MRN: [id number]  Sore Throat   This is a new problem. The current episode started in the past 7 days. The pain is worse on the left side. There has been no fever. Associated symptoms include ear pain, a hoarse voice, neck pain and swollen glands.  Has tried Flonase and an Allergy pill called Doterra, neither have helped.  Results for orders placed or performed in visit on 05/18/17  POCT rapid strep A  Result Value Ref Range   Rapid Strep A Screen Negative Negative   First noticed glands in the next sore  Then sore throat worse in the or  Then bad pain in the ear  No obv dranage or stuffiness  Some headache frontal   Hoarseness post throat clearing  thr felt irritated   Usus flonae claritin makes pt feel rough     Review of Systems  HENT: Positive for ear pain and hoarse voice.   Musculoskeletal: Positive for neck pain.       Objective:   Physical Exam  Alert, mild malaise. Hydration good Vitals stable. frontal/ maxillary tenderness evident positive nasal congestion. pharynx normal neck supple  lungs clear/no crackles or wheezes. heart regular in rhythm      Assessment & Plan:  Impression rhinosinusitis likely post viral, discussed with patient. plan antibiotics prescribed. Questions answered. Symptomatic care discussed. warning signs discussed. WSL

## 2017-05-19 LAB — STREP A DNA PROBE: STREP GP A DIRECT, DNA PROBE: NEGATIVE

## 2017-05-26 ENCOUNTER — Other Ambulatory Visit: Payer: Self-pay | Admitting: Nurse Practitioner

## 2017-05-26 NOTE — Telephone Encounter (Signed)
Last seen 12/01/16

## 2017-06-05 ENCOUNTER — Ambulatory Visit (INDEPENDENT_AMBULATORY_CARE_PROVIDER_SITE_OTHER): Payer: BC Managed Care – PPO | Admitting: Family Medicine

## 2017-06-05 ENCOUNTER — Encounter: Payer: Self-pay | Admitting: Family Medicine

## 2017-06-05 VITALS — BP 130/86 | Ht 67.0 in | Wt 174.0 lb

## 2017-06-05 DIAGNOSIS — Z23 Encounter for immunization: Secondary | ICD-10-CM | POA: Diagnosis not present

## 2017-06-05 DIAGNOSIS — I10 Essential (primary) hypertension: Secondary | ICD-10-CM

## 2017-06-05 DIAGNOSIS — G4700 Insomnia, unspecified: Secondary | ICD-10-CM

## 2017-06-05 MED ORDER — MELOXICAM 15 MG PO TABS: 15.0000 mg | ORAL_TABLET | Freq: Every day | ORAL | 1 refills | Status: DC

## 2017-06-05 MED ORDER — PANTOPRAZOLE SODIUM 40 MG PO TBEC: 40.0000 mg | DELAYED_RELEASE_TABLET | Freq: Two times a day (BID) | ORAL | 11 refills | Status: DC

## 2017-06-05 MED ORDER — TRAZODONE HCL 50 MG PO TABS: 50.0000 mg | ORAL_TABLET | Freq: Every evening | ORAL | 3 refills | Status: DC | PRN

## 2017-06-05 NOTE — Patient Instructions (Signed)
DASH Eating Plan DASH stands for "Dietary Approaches to Stop Hypertension." The DASH eating plan is a healthy eating plan that has been shown to reduce high blood pressure (hypertension). It may also reduce your risk for type 2 diabetes, heart disease, and stroke. The DASH eating plan may also help with weight loss. What are tips for following this plan? General guidelines  Avoid eating more than 2,300 mg (milligrams) of salt (sodium) a day. If you have hypertension, you may need to reduce your sodium intake to 1,500 mg a day.  Limit alcohol intake to no more than 1 drink a day for nonpregnant women and 2 drinks a day for men. One drink equals 12 oz of beer, 5 oz of wine, or 1 oz of hard liquor.  Work with your health care provider to maintain a healthy body weight or to lose weight. Ask what an ideal weight is for you.  Get at least 30 minutes of exercise that causes your heart to beat faster (aerobic exercise) most days of the week. Activities may include walking, swimming, or biking.  Work with your health care provider or diet and nutrition specialist (dietitian) to adjust your eating plan to your individual calorie needs. Reading food labels  Check food labels for the amount of sodium per serving. Choose foods with less than 5 percent of the Daily Value of sodium. Generally, foods with less than 300 mg of sodium per serving fit into this eating plan.  To find whole grains, look for the word "whole" as the first word in the ingredient list. Shopping  Buy products labeled as "low-sodium" or "no salt added."  Buy fresh foods. Avoid canned foods and premade or frozen meals. Cooking  Avoid adding salt when cooking. Use salt-free seasonings or herbs instead of table salt or sea salt. Check with your health care provider or pharmacist before using salt substitutes.  Do not fry foods. Cook foods using healthy methods such as baking, boiling, grilling, and broiling instead.  Cook with  heart-healthy oils, such as olive, canola, soybean, or sunflower oil. Meal planning   Eat a balanced diet that includes: ? 5 or more servings of fruits and vegetables each day. At each meal, try to fill half of your plate with fruits and vegetables. ? Up to 6-8 servings of whole grains each day. ? Less than 6 oz of lean meat, poultry, or fish each day. A 3-oz serving of meat is about the same size as a deck of cards. One egg equals 1 oz. ? 2 servings of low-fat dairy each day. ? A serving of nuts, seeds, or beans 5 times each week. ? Heart-healthy fats. Healthy fats called Omega-3 fatty acids are found in foods such as flaxseeds and coldwater fish, like sardines, salmon, and mackerel.  Limit how much you eat of the following: ? Canned or prepackaged foods. ? Food that is high in trans fat, such as fried foods. ? Food that is high in saturated fat, such as fatty meat. ? Sweets, desserts, sugary drinks, and other foods with added sugar. ? Full-fat dairy products.  Do not salt foods before eating.  Try to eat at least 2 vegetarian meals each week.  Eat more home-cooked food and less restaurant, buffet, and fast food.  When eating at a restaurant, ask that your food be prepared with less salt or no salt, if possible. What foods are recommended? The items listed may not be a complete list. Talk with your dietitian about what   dietary choices are best for you. Grains Whole-grain or whole-wheat bread. Whole-grain or whole-wheat pasta. Brown rice. Oatmeal. Quinoa. Bulgur. Whole-grain and low-sodium cereals. Pita bread. Low-fat, low-sodium crackers. Whole-wheat flour tortillas. Vegetables Fresh or frozen vegetables (raw, steamed, roasted, or grilled). Low-sodium or reduced-sodium tomato and vegetable juice. Low-sodium or reduced-sodium tomato sauce and tomato paste. Low-sodium or reduced-sodium canned vegetables. Fruits All fresh, dried, or frozen fruit. Canned fruit in natural juice (without  added sugar). Meat and other protein foods Skinless chicken or turkey. Ground chicken or turkey. Pork with fat trimmed off. Fish and seafood. Egg whites. Dried beans, peas, or lentils. Unsalted nuts, nut butters, and seeds. Unsalted canned beans. Lean cuts of beef with fat trimmed off. Low-sodium, lean deli meat. Dairy Low-fat (1%) or fat-free (skim) milk. Fat-free, low-fat, or reduced-fat cheeses. Nonfat, low-sodium ricotta or cottage cheese. Low-fat or nonfat yogurt. Low-fat, low-sodium cheese. Fats and oils Soft margarine without trans fats. Vegetable oil. Low-fat, reduced-fat, or light mayonnaise and salad dressings (reduced-sodium). Canola, safflower, olive, soybean, and sunflower oils. Avocado. Seasoning and other foods Herbs. Spices. Seasoning mixes without salt. Unsalted popcorn and pretzels. Fat-free sweets. What foods are not recommended? The items listed may not be a complete list. Talk with your dietitian about what dietary choices are best for you. Grains Baked goods made with fat, such as croissants, muffins, or some breads. Dry pasta or rice meal packs. Vegetables Creamed or fried vegetables. Vegetables in a cheese sauce. Regular canned vegetables (not low-sodium or reduced-sodium). Regular canned tomato sauce and paste (not low-sodium or reduced-sodium). Regular tomato and vegetable juice (not low-sodium or reduced-sodium). Pickles. Olives. Fruits Canned fruit in a light or heavy syrup. Fried fruit. Fruit in cream or butter sauce. Meat and other protein foods Fatty cuts of meat. Ribs. Fried meat. Bacon. Sausage. Bologna and other processed lunch meats. Salami. Fatback. Hotdogs. Bratwurst. Salted nuts and seeds. Canned beans with added salt. Canned or smoked fish. Whole eggs or egg yolks. Chicken or turkey with skin. Dairy Whole or 2% milk, cream, and half-and-half. Whole or full-fat cream cheese. Whole-fat or sweetened yogurt. Full-fat cheese. Nondairy creamers. Whipped toppings.  Processed cheese and cheese spreads. Fats and oils Butter. Stick margarine. Lard. Shortening. Ghee. Bacon fat. Tropical oils, such as coconut, palm kernel, or palm oil. Seasoning and other foods Salted popcorn and pretzels. Onion salt, garlic salt, seasoned salt, table salt, and sea salt. Worcestershire sauce. Tartar sauce. Barbecue sauce. Teriyaki sauce. Soy sauce, including reduced-sodium. Steak sauce. Canned and packaged gravies. Fish sauce. Oyster sauce. Cocktail sauce. Horseradish that you find on the shelf. Ketchup. Mustard. Meat flavorings and tenderizers. Bouillon cubes. Hot sauce and Tabasco sauce. Premade or packaged marinades. Premade or packaged taco seasonings. Relishes. Regular salad dressings. Where to find more information:  National Heart, Lung, and Blood Institute: www.nhlbi.nih.gov  American Heart Association: www.heart.org Summary  The DASH eating plan is a healthy eating plan that has been shown to reduce high blood pressure (hypertension). It may also reduce your risk for type 2 diabetes, heart disease, and stroke.  With the DASH eating plan, you should limit salt (sodium) intake to 2,300 mg a day. If you have hypertension, you may need to reduce your sodium intake to 1,500 mg a day.  When on the DASH eating plan, aim to eat more fresh fruits and vegetables, whole grains, lean proteins, low-fat dairy, and heart-healthy fats.  Work with your health care provider or diet and nutrition specialist (dietitian) to adjust your eating plan to your individual   calorie needs. This information is not intended to replace advice given to you by your health care provider. Make sure you discuss any questions you have with your health care provider. Document Released: 08/25/2011 Document Revised: 08/29/2016 Document Reviewed: 08/29/2016 Elsevier Interactive Patient Education  2017 Elsevier Inc.  

## 2017-06-05 NOTE — Progress Notes (Signed)
   Subjective:    Patient ID: Robin Dixon, female    DOB: 1965-11-28, 51 y.o.   MRN: [id number]  Hypertension  This is a new problem. The current episode started more than 1 year ago. Pertinent negatives include no chest pain, headaches or shortness of breath.  Patient does good job taking her medications and she keeps it under control with activity not been exercising quite as much is normal. States she takes a reflux medicine regular basis She takes her medicine for arthralgias states it helps her hip pain Denies abdominal pain vomiting rectal bleeding She uses her medication to help her rest at night denies issues with that. Eats healthy and exercises. On Norvasc 2.5 mg one daily.  Review of Systems  Constitutional: Negative for activity change, fatigue and fever.  HENT: Negative for congestion.   Respiratory: Negative for cough, chest tightness and shortness of breath.   Cardiovascular: Negative for chest pain and leg swelling.  Gastrointestinal: Negative for abdominal pain.  Skin: Negative for color change.  Neurological: Negative for headaches.  Psychiatric/Behavioral: Negative for behavioral problems.       Objective:   Physical Exam  Constitutional: She appears well-developed and well-nourished. No distress.  HENT:  Head: Normocephalic and atraumatic.  Eyes: Right eye exhibits no discharge. Left eye exhibits no discharge.  Neck: No tracheal deviation present.  Cardiovascular: Normal rate, regular rhythm and normal heart sounds.   No murmur heard. Pulmonary/Chest: Effort normal and breath sounds normal. No respiratory distress. She has no wheezes. She has no rales.  Musculoskeletal: She exhibits no edema.  Lymphadenopathy:    She has no cervical adenopathy.  Neurological: She is alert. She exhibits normal muscle tone.  Skin: Skin is warm and dry. No erythema.  Psychiatric: Her behavior is normal.  Vitals reviewed.         Assessment & Plan:  Insomnia  continue trazodone when necessary Also use Xanax twice daily as necessary to help with anxiousness Lexapro does help with her moods continue this Reflux under good control with medication Benefit of aspirin questionable with current research increased risk of GI bleed recommend stopping aspirin Blood pressure borderline control watch diet exercise more check blood pressure several times as an outpatient notify us of the results Follow-up 6 months Recent lab work reviewed no need to repeat any currently recheck vitamin D level in the spring

## 2017-06-06 ENCOUNTER — Other Ambulatory Visit: Payer: BC Managed Care – PPO | Admitting: Adult Health

## 2017-06-19 ENCOUNTER — Telehealth: Payer: Self-pay | Admitting: Family Medicine

## 2017-06-19 ENCOUNTER — Ambulatory Visit (INDEPENDENT_AMBULATORY_CARE_PROVIDER_SITE_OTHER): Payer: BC Managed Care – PPO | Admitting: Adult Health

## 2017-06-19 ENCOUNTER — Encounter: Payer: Self-pay | Admitting: Adult Health

## 2017-06-19 VITALS — BP 140/78 | HR 72 | Ht 65.75 in | Wt 172.4 lb

## 2017-06-19 DIAGNOSIS — Z01411 Encounter for gynecological examination (general) (routine) with abnormal findings: Secondary | ICD-10-CM

## 2017-06-19 DIAGNOSIS — Z1212 Encounter for screening for malignant neoplasm of rectum: Secondary | ICD-10-CM

## 2017-06-19 DIAGNOSIS — Z1211 Encounter for screening for malignant neoplasm of colon: Secondary | ICD-10-CM

## 2017-06-19 DIAGNOSIS — K219 Gastro-esophageal reflux disease without esophagitis: Secondary | ICD-10-CM | POA: Insufficient documentation

## 2017-06-19 DIAGNOSIS — Z01419 Encounter for gynecological examination (general) (routine) without abnormal findings: Secondary | ICD-10-CM | POA: Insufficient documentation

## 2017-06-19 LAB — HEMOCCULT GUIAC POC 1CARD (OFFICE): Fecal Occult Blood, POC: NEGATIVE

## 2017-06-19 MED ORDER — DEXLANSOPRAZOLE 60 MG PO CPDR: 60.0000 mg | DELAYED_RELEASE_CAPSULE | Freq: Every day | ORAL | 0 refills | Status: DC

## 2017-06-19 NOTE — Telephone Encounter (Signed)
Taking protonix  one bid. Pt states she woke up Friday night with throat burning and feeling raw. Has had a congested cough since then.

## 2017-06-19 NOTE — Telephone Encounter (Signed)
It is hard to know if the symptoms she is having now is truly just related to Protonix-it is also certainly possible patient can still have reflux symptoms even when on medications. Medications cut down on acid but do not stop regurgitation. Being very careful to minimize caffeine's, tomato based products, chocolates. Also it is wise to put the head of bed on 6 inch blocks so that when a person sleeps at night they are on a slight incline. We will send in medication to take the place of protonic's by morning time.-It is hard to know if insurance will cover this change or not unless we try. Obviously if symptoms continue will need follow-up office visit

## 2017-06-19 NOTE — Telephone Encounter (Signed)
Pt states that her Protonix is no longer working wants to know if something else can be called in.    [facility]

## 2017-06-19 NOTE — Telephone Encounter (Signed)
Discussed with pt. Pt verbalized understanding.  °

## 2017-06-19 NOTE — Progress Notes (Signed)
Patient ID: Robin Dixon, female   DOB: 17-Jan-1966, 51 y.o.   MRN: [id number] History of Present Illness: Robin Dixon is a 51 year old white female, PM in for well woman gyn exam, she had normal pap with negative HPV 06/02/16. PCP is [facility].    Current Medications, Allergies, Past Medical History, Past Surgical History, Family History and Social History were reviewed in [employer] record.     Review of Systems:  Patient denies any headaches, hearing loss, fatigue, blurred vision, shortness of breath, chest pain, abdominal pain, problems with bowel movements, urination, or intercourse. No joint pain or mood swings.+acid reflux, throat burns and is hoarse some, has been taking Protonix.    Physical Exam:BP 140/78 (BP Location: Right Arm, Patient Position: Sitting, Cuff Size: Normal)   Pulse 72   Ht 5' 5.75" (1.67 m)   Wt 172 lb 6.4 oz (78.2 kg)   BMI 28.04 kg/m  General:  Well developed, well nourished, no acute distress Skin:  Warm and dry Neck:  Midline trachea, normal thyroid, good ROM, no lymphadenopathy Lungs; Clear to auscultation bilaterally Breast:  No dominant palpable mass, retraction, or nipple discharge Cardiovascular: Regular rate and rhythm Abdomen:  Soft, non tender, no hepatosplenomegaly Pelvic:  External genitalia is normal in appearance, no lesions.  The vagina is normal in appearance. Urethra has no lesions or masses. The cervix is smooth.  Uterus is felt to be normal size, shape, and contour.  No adnexal masses or tenderness noted.Bladder is non tender, no masses felt. Rectal: Good sphincter tone, no polyps, or hemorrhoids felt.  Hemoccult negative. Extremities/musculoskeletal:  No swelling or varicosities noted, no clubbing or cyanosis Psych:  No mood changes, alert and cooperative,seems happy PHQ 2 score 0.  Impression: 1. Encounter for well woman exam with routine gynecological exam   2. Screening for colorectal cancer   3.  Gastroesophageal reflux disease, esophagitis presence not specified       Plan: Meds ordered this encounter  Medications  . dexlansoprazole (DEXILANT) 60 MG capsule    Sig: Take 1 capsule (60 mg total) by mouth daily.    Dispense:  10 capsule    Refill:  0    Order Specific Question:   Supervising Provider    Answer:   Lazaro Arms [[id number]]  She has Called PCP about reflux, but gave her 10 Dexilant to try, lot [id number] exp 2/21  Physical in 1 year Pap in 2020 Mammogram yearly Labs with PCP Colonoscopy per GI

## 2017-06-19 NOTE — Telephone Encounter (Signed)
Left message to return call 

## 2017-06-19 NOTE — Patient Instructions (Signed)
Physical in 1 year Pap in 2020 Mammogram yearly Labs with PCP Colonoscopy per GI

## 2017-06-22 ENCOUNTER — Telehealth: Payer: Self-pay | Admitting: Adult Health

## 2017-06-22 ENCOUNTER — Other Ambulatory Visit: Payer: Self-pay

## 2017-06-22 ENCOUNTER — Telehealth: Payer: Self-pay | Admitting: Family Medicine

## 2017-06-22 MED ORDER — DEXLANSOPRAZOLE 60 MG PO CPDR: 60.0000 mg | DELAYED_RELEASE_CAPSULE | Freq: Every day | ORAL | 11 refills | Status: DC

## 2017-06-22 NOTE — Telephone Encounter (Signed)
Patient called stating that she seen Victorino Dike not to long a go and that she gave her some medication of her heartburn, pt states that [place] told her that she will give her a prescription of it this one time but she would need to get the rest from her primary care doctor. Pt states that she has called her pharmacy and she did not get the prescription. She call her primary care and they told her to contact [place]. Pt states that she just wants a prescription sent to her pharmacy.

## 2017-06-22 NOTE — Telephone Encounter (Signed)
Patient states she has gotten everything straight with PCP and pharmacy.

## 2017-06-22 NOTE — Addendum Note (Signed)
Addended by: Theodora Blow R on: 06/22/2017 03:29 PM   Modules accepted: Orders

## 2017-06-22 NOTE — Telephone Encounter (Signed)
Patient called back stating that Medication was not sent in on 06/19/17. Please see message. In message it said that we were going to call something in to work in place of patient's Protonix but nothing was ever sent in. [facility] Dexilant. Patient states Dexilant seems to be working well but if you would like for her to take something else it is fine. Please advise?

## 2017-06-22 NOTE — Telephone Encounter (Signed)
Spoke with patient and informed her per Dr.Scott Luking- We are sending in Dexilant 60 mg to your pharmacy. Patient verbalized understanding.

## 2017-06-22 NOTE — Telephone Encounter (Signed)
Dexilant is fine, 60 mg, #30, 12 refills

## 2017-08-29 ENCOUNTER — Other Ambulatory Visit: Payer: Self-pay | Admitting: Family Medicine

## 2017-08-30 NOTE — Telephone Encounter (Signed)
May have this +3 refills on each patient needs to do follow-up office visit in the spring

## 2017-08-31 ENCOUNTER — Other Ambulatory Visit: Payer: Self-pay | Admitting: Family Medicine

## 2017-10-07 ENCOUNTER — Encounter: Payer: Self-pay | Admitting: Family Medicine

## 2017-10-09 ENCOUNTER — Other Ambulatory Visit: Payer: Self-pay | Admitting: Family Medicine

## 2017-10-09 MED ORDER — AMLODIPINE BESYLATE 5 MG PO TABS: 5.0000 mg | ORAL_TABLET | Freq: Every day | ORAL | 5 refills | Status: DC

## 2017-10-25 ENCOUNTER — Encounter: Payer: Self-pay | Admitting: Family Medicine

## 2017-10-25 NOTE — Telephone Encounter (Signed)
Pt states she was changed from amlodipine 2.5 mg daily to 5 mg daily. States you were wanting diastolic below 80, and it is still over that Q time she check it, which is anytime she is at a [employer] or somewhere there is a bp machine. She states Q now and then feels light headed not Q day,but it is normally around the same time of the day.Sometimes feels like she has fluttering feeling in chest occasionally,but has history of irregular heart rate.(Not currently) Appointment with you which was the next available is sched for 11/06/2017.Please advise. Move up sooner?

## 2017-10-26 NOTE — Telephone Encounter (Signed)
Please move appointment up to next week have her bring her medications and her cuff with her

## 2017-11-06 ENCOUNTER — Ambulatory Visit: Payer: BC Managed Care – PPO | Admitting: Family Medicine

## 2017-11-06 ENCOUNTER — Encounter: Payer: Self-pay | Admitting: Family Medicine

## 2017-11-06 VITALS — BP 138/84 | Ht 65.75 in | Wt 176.8 lb

## 2017-11-06 DIAGNOSIS — I10 Essential (primary) hypertension: Secondary | ICD-10-CM | POA: Diagnosis not present

## 2017-11-06 DIAGNOSIS — K219 Gastro-esophageal reflux disease without esophagitis: Secondary | ICD-10-CM | POA: Diagnosis not present

## 2017-11-06 DIAGNOSIS — Z1159 Encounter for screening for other viral diseases: Secondary | ICD-10-CM | POA: Diagnosis not present

## 2017-11-06 DIAGNOSIS — Z114 Encounter for screening for human immunodeficiency virus [HIV]: Secondary | ICD-10-CM

## 2017-11-06 DIAGNOSIS — E7849 Other hyperlipidemia: Secondary | ICD-10-CM

## 2017-11-06 MED ORDER — ALPRAZOLAM 1 MG PO TABS: 1.0000 mg | ORAL_TABLET | Freq: Two times a day (BID) | ORAL | 5 refills | Status: DC | PRN

## 2017-11-06 MED ORDER — ESCITALOPRAM OXALATE 20 MG PO TABS: 20.0000 mg | ORAL_TABLET | Freq: Every day | ORAL | 1 refills | Status: DC

## 2017-11-06 NOTE — Patient Instructions (Signed)
DASH Eating Plan DASH stands for "Dietary Approaches to Stop Hypertension." The DASH eating plan is a healthy eating plan that has been shown to reduce high blood pressure (hypertension). It may also reduce your risk for type 2 diabetes, heart disease, and stroke. The DASH eating plan may also help with weight loss. What are tips for following this plan? General guidelines  Avoid eating more than 2,300 mg (milligrams) of salt (sodium) a day. If you have hypertension, you may need to reduce your sodium intake to 1,500 mg a day.  Limit alcohol intake to no more than 1 drink a day for nonpregnant women and 2 drinks a day for men. One drink equals 12 oz of beer, 5 oz of wine, or 1 oz of hard liquor.  Work with your health care provider to maintain a healthy body weight or to lose weight. Ask what an ideal weight is for you.  Get at least 30 minutes of exercise that causes your heart to beat faster (aerobic exercise) most days of the week. Activities may include walking, swimming, or biking.  Work with your health care provider or diet and nutrition specialist (dietitian) to adjust your eating plan to your individual calorie needs. Reading food labels  Check food labels for the amount of sodium per serving. Choose foods with less than 5 percent of the Daily Value of sodium. Generally, foods with less than 300 mg of sodium per serving fit into this eating plan.  To find whole grains, look for the word "whole" as the first word in the ingredient list. Shopping  Buy products labeled as "low-sodium" or "no salt added."  Buy fresh foods. Avoid canned foods and premade or frozen meals. Cooking  Avoid adding salt when cooking. Use salt-free seasonings or herbs instead of table salt or sea salt. Check with your health care provider or pharmacist before using salt substitutes.  Do not fry foods. Cook foods using healthy methods such as baking, boiling, grilling, and broiling instead.  Cook with  heart-healthy oils, such as olive, canola, soybean, or sunflower oil. Meal planning   Eat a balanced diet that includes: ? 5 or more servings of fruits and vegetables each day. At each meal, try to fill half of your plate with fruits and vegetables. ? Up to 6-8 servings of whole grains each day. ? Less than 6 oz of lean meat, poultry, or fish each day. A 3-oz serving of meat is about the same size as a deck of cards. One egg equals 1 oz. ? 2 servings of low-fat dairy each day. ? A serving of nuts, seeds, or beans 5 times each week. ? Heart-healthy fats. Healthy fats called Omega-3 fatty acids are found in foods such as flaxseeds and coldwater fish, like sardines, salmon, and mackerel.  Limit how much you eat of the following: ? Canned or prepackaged foods. ? Food that is high in trans fat, such as fried foods. ? Food that is high in saturated fat, such as fatty meat. ? Sweets, desserts, sugary drinks, and other foods with added sugar. ? Full-fat dairy products.  Do not salt foods before eating.  Try to eat at least 2 vegetarian meals each week.  Eat more home-cooked food and less restaurant, buffet, and fast food.  When eating at a restaurant, ask that your food be prepared with less salt or no salt, if possible. What foods are recommended? The items listed may not be a complete list. Talk with your dietitian about what   dietary choices are best for you. Grains Whole-grain or whole-wheat bread. Whole-grain or whole-wheat pasta. Brown rice. Oatmeal. Quinoa. Bulgur. Whole-grain and low-sodium cereals. Pita bread. Low-fat, low-sodium crackers. Whole-wheat flour tortillas. Vegetables Fresh or frozen vegetables (raw, steamed, roasted, or grilled). Low-sodium or reduced-sodium tomato and vegetable juice. Low-sodium or reduced-sodium tomato sauce and tomato paste. Low-sodium or reduced-sodium canned vegetables. Fruits All fresh, dried, or frozen fruit. Canned fruit in natural juice (without  added sugar). Meat and other protein foods Skinless chicken or turkey. Ground chicken or turkey. Pork with fat trimmed off. Fish and seafood. Egg whites. Dried beans, peas, or lentils. Unsalted nuts, nut butters, and seeds. Unsalted canned beans. Lean cuts of beef with fat trimmed off. Low-sodium, lean deli meat. Dairy Low-fat (1%) or fat-free (skim) milk. Fat-free, low-fat, or reduced-fat cheeses. Nonfat, low-sodium ricotta or cottage cheese. Low-fat or nonfat yogurt. Low-fat, low-sodium cheese. Fats and oils Soft margarine without trans fats. Vegetable oil. Low-fat, reduced-fat, or light mayonnaise and salad dressings (reduced-sodium). Canola, safflower, olive, soybean, and sunflower oils. Avocado. Seasoning and other foods Herbs. Spices. Seasoning mixes without salt. Unsalted popcorn and pretzels. Fat-free sweets. What foods are not recommended? The items listed may not be a complete list. Talk with your dietitian about what dietary choices are best for you. Grains Baked goods made with fat, such as croissants, muffins, or some breads. Dry pasta or rice meal packs. Vegetables Creamed or fried vegetables. Vegetables in a cheese sauce. Regular canned vegetables (not low-sodium or reduced-sodium). Regular canned tomato sauce and paste (not low-sodium or reduced-sodium). Regular tomato and vegetable juice (not low-sodium or reduced-sodium). Pickles. Olives. Fruits Canned fruit in a light or heavy syrup. Fried fruit. Fruit in cream or butter sauce. Meat and other protein foods Fatty cuts of meat. Ribs. Fried meat. Bacon. Sausage. Bologna and other processed lunch meats. Salami. Fatback. Hotdogs. Bratwurst. Salted nuts and seeds. Canned beans with added salt. Canned or smoked fish. Whole eggs or egg yolks. Chicken or turkey with skin. Dairy Whole or 2% milk, cream, and half-and-half. Whole or full-fat cream cheese. Whole-fat or sweetened yogurt. Full-fat cheese. Nondairy creamers. Whipped toppings.  Processed cheese and cheese spreads. Fats and oils Butter. Stick margarine. Lard. Shortening. Ghee. Bacon fat. Tropical oils, such as coconut, palm kernel, or palm oil. Seasoning and other foods Salted popcorn and pretzels. Onion salt, garlic salt, seasoned salt, table salt, and sea salt. Worcestershire sauce. Tartar sauce. Barbecue sauce. Teriyaki sauce. Soy sauce, including reduced-sodium. Steak sauce. Canned and packaged gravies. Fish sauce. Oyster sauce. Cocktail sauce. Horseradish that you find on the shelf. Ketchup. Mustard. Meat flavorings and tenderizers. Bouillon cubes. Hot sauce and Tabasco sauce. Premade or packaged marinades. Premade or packaged taco seasonings. Relishes. Regular salad dressings. Where to find more information:  National Heart, Lung, and Blood Institute: www.nhlbi.nih.gov  American Heart Association: www.heart.org Summary  The DASH eating plan is a healthy eating plan that has been shown to reduce high blood pressure (hypertension). It may also reduce your risk for type 2 diabetes, heart disease, and stroke.  With the DASH eating plan, you should limit salt (sodium) intake to 2,300 mg a day. If you have hypertension, you may need to reduce your sodium intake to 1,500 mg a day.  When on the DASH eating plan, aim to eat more fresh fruits and vegetables, whole grains, lean proteins, low-fat dairy, and heart-healthy fats.  Work with your health care provider or diet and nutrition specialist (dietitian) to adjust your eating plan to your individual   calorie needs. This information is not intended to replace advice given to you by your health care provider. Make sure you discuss any questions you have with your health care provider. Document Released: 08/25/2011 Document Revised: 08/29/2016 Document Reviewed: 08/29/2016 Elsevier Interactive Patient Education  2018 Else[employer] Inc.  

## 2017-11-06 NOTE — Progress Notes (Signed)
   Subjective:    Patient ID: Robin Dixon, female    DOB: 06/21/1966, 52 y.o.   MRN: [id number]  HPI  Patient arrives for a follow up on blood pressure. Patient states her blood pressure is running high in the evenings.   Patient for blood pressure check up. Patient relates compliance with meds. Todays BP reviewed with the patient. Patient denies issues with medication. Patient relates reasonable diet. Patient tries to minimize salt. Patient aware of BP goals.  Patient here for follow-up regarding cholesterol.  Patient does try to maintain a reasonable diet.    The patient denies any obvious side effects.  Prior blood work results reviewed with the patient.  The patient is aware of his cholesterol goals and the need to keep it under good control to lessen the risk of disease.   Review of Systems  Constitutional: Negative for activity change, fatigue and fever.  HENT: Negative for congestion.   Respiratory: Negative for cough, chest tightness and shortness of breath.   Cardiovascular: Negative for chest pain and leg swelling.  Gastrointestinal: Negative for abdominal pain.  Skin: Negative for color change.  Neurological: Negative for headaches.  Psychiatric/Behavioral: Negative for behavioral problems.       Objective:   Physical Exam  Constitutional: She appears well-developed and well-nourished. No distress.  HENT:  Head: Normocephalic and atraumatic.  Eyes: Right eye exhibits no discharge. Left eye exhibits no discharge.  Neck: No tracheal deviation present.  Cardiovascular: Normal rate, regular rhythm and normal heart sounds.  No murmur heard. Pulmonary/Chest: Effort normal and breath sounds normal. No respiratory distress. She has no wheezes. She has no rales.  Musculoskeletal: She exhibits no edema.  Lymphadenopathy:    She has no cervical adenopathy.  Neurological: She is alert. She exhibits normal muscle tone.  Skin: Skin is warm and dry. No erythema.  Psychiatric:  Her behavior is normal.  Vitals reviewed.         Assessment & Plan:  HTN- Patient was seen today as part of a visit regarding hypertension. The importance of healthy diet and regular physical activity was discussed. The importance of compliance with medications discussed. Ideal goal is to keep blood pressure low elevated levels certainly below 140/90 when possible. The patient was counseled that keeping blood pressure under control lessen his risk of heart attack, stroke, kidney failure, and early death. The importance of regular follow-ups was discussed with the patient. Low-salt diet such as DASH recommended. Regular physical activity was recommended as well. Patient was advised to keep regular follow-ups. Blood pressure readings are good I do not recommend any adjustment at this point  Reflux under good control currently continue current medicine  The patient was seen today as part of an evaluation regarding hyperlipidemia. Recent lab work has been reviewed with the patient as well as the goals for good cholesterol care.  Dietary measures discussed in detail finally the patient is aware that poor control of cholesterol, noncompliance can dramatically increase her risk of heart attack strokes and premature death. The patient will keep regular office visits and the patient does agreed to periodic lab work. Doing well without the medication.

## 2017-11-07 LAB — BASIC METABOLIC PANEL
BUN / CREAT RATIO: 25 — AB (ref 9–23)
BUN: 21 mg/dL (ref 6–24)
CO2: 22 mmol/L (ref 20–29)
CREATININE: 0.85 mg/dL (ref 0.57–1.00)
Calcium: 9.5 mg/dL (ref 8.7–10.2)
Chloride: 105 mmol/L (ref 96–106)
GFR calc Af Amer: 90 mL/min/{1.73_m2} (ref >59)
GFR calc non Af Amer: 78 mL/min/{1.73_m2} (ref >59)
GLUCOSE: 104 mg/dL — AB (ref 65–99)
Potassium: 4.8 mmol/L (ref 3.5–5.2)
Sodium: 142 mmol/L (ref 134–144)

## 2017-11-07 LAB — LIPID PANEL
CHOLESTEROL TOTAL: 232 mg/dL — AB (ref 100–199)
Chol/HDL Ratio: 2.9 ratio (ref 0.0–4.4)
HDL: 80 mg/dL (ref >39)
LDL Calculated: 130 mg/dL — ABNORMAL HIGH (ref 0–99)
Triglycerides: 111 mg/dL (ref 0–149)
VLDL Cholesterol Cal: 22 mg/dL (ref 5–40)

## 2017-11-07 LAB — HEPATITIS C ANTIBODY: Hep C Virus Ab: <0.1 s/co ratio (ref 0.0–0.9)

## 2017-11-07 LAB — T4, FREE: FREE T4: 1.04 ng/dL (ref 0.82–1.77)

## 2017-11-07 LAB — TSH: TSH: 2.07 u[IU]/mL (ref 0.450–4.500)

## 2017-11-07 LAB — HIV ANTIBODY (ROUTINE TESTING W REFLEX): HIV Screen 4th Generation wRfx: NONREACTIVE

## 2017-11-19 ENCOUNTER — Encounter: Payer: Self-pay | Admitting: Family Medicine

## 2017-11-20 ENCOUNTER — Telehealth: Payer: Self-pay | Admitting: Family Medicine

## 2017-11-20 NOTE — Telephone Encounter (Signed)
Please see phone message more than likely child will need I would recommend giving symptoms 3-5 days to see if it runs its own course

## 2017-11-20 NOTE — Telephone Encounter (Signed)
Rosey Batheresa called (sent you a mychart message also) because Christoria was seen on 11-06-17 and she said you told her that probably the whole family would end up getting sick and to just call us back.  Christoria got better but now her symptoms are coming back.  She mainly has a cough, no fever. Rosey Batheresa has cough, h/a, congestion, some ear pain and scratchy throat. Jeannett SeniorStephen has cough, congestion and sorethroat. No fever Darel HongJudy also had symptoms but she had some antibiotic at home she started and is feeling better now.  Can we call in medicine to [facility] in [place]? (Messages put in each patient's chart as well)

## 2017-11-20 NOTE — Telephone Encounter (Signed)
Patient notified that Dr Lorin PicketScott feels like the family is all sharing a virus and that can take 3-5 days for improvement. Warning signs discussed with patient and advised office visit or ER if no better or worse. Patient verbalized understanding.

## 2017-11-20 NOTE — Telephone Encounter (Signed)
I believe there is some misinterpretation of what I said.  Back when the young girl was seen I told the family that there was underlying virus along with pneumonia.  The antibiotics was for the lung infection, the underlying virus was what was triggering or another words setting up the child for the other infection.  I told the family that the underlying virus could potentially run through the household.  At this point what they are experiencing is probably not what the young girl had back in the middle of February.

## 2017-11-20 NOTE — Telephone Encounter (Signed)
Nurses see phone messages

## 2017-11-22 ENCOUNTER — Encounter: Payer: Self-pay | Admitting: Family Medicine

## 2017-11-22 ENCOUNTER — Ambulatory Visit: Payer: BC Managed Care – PPO | Admitting: Family Medicine

## 2017-11-22 VITALS — BP 138/88 | Temp 98.7°F | Ht 65.75 in | Wt 178.0 lb

## 2017-11-22 DIAGNOSIS — B9689 Other specified bacterial agents as the cause of diseases classified elsewhere: Secondary | ICD-10-CM | POA: Diagnosis not present

## 2017-11-22 DIAGNOSIS — J019 Acute sinusitis, unspecified: Secondary | ICD-10-CM

## 2017-11-22 MED ORDER — AMOXICILLIN-POT CLAVULANATE 875-125 MG PO TABS: 1.0000 | ORAL_TABLET | Freq: Two times a day (BID) | ORAL | 0 refills | Status: DC

## 2017-11-22 NOTE — Patient Instructions (Signed)

## 2017-11-22 NOTE — Progress Notes (Signed)
   Subjective:    Patient ID: Robin Dixon, female    DOB: 05/30/1966, 52 y.o.   MRN: [id number]  HPI Patient is here today for a sinus headache and throat is sore, and left ear is stopped up,sinus drainage,cough,runny nose this started on Saturday, she has been taking tylenol which has not helped. Head congestion drainage coughing sinus pressure denies high fever chills sweats  Review of Systems  Constitutional: Negative for activity change and fever.  HENT: Positive for congestion and rhinorrhea. Negative for ear pain.   Eyes: Negative for discharge.  Respiratory: Positive for cough. Negative for shortness of breath and wheezing.   Cardiovascular: Negative for chest pain.       Objective:   Physical Exam  Constitutional: She appears well-developed.  HENT:  Head: Normocephalic.  Right Ear: External ear normal.  Left Ear: External ear normal.  Nose: Nose normal.  Mouth/Throat: Oropharynx is clear and moist. No oropharyngeal exudate.  Eyes: Right eye exhibits no discharge. Left eye exhibits no discharge.  Neck: Neck supple. No tracheal deviation present.  Cardiovascular: Normal rate and normal heart sounds.  No murmur heard. Pulmonary/Chest: Effort normal and breath sounds normal. She has no wheezes. She has no rales.  Lymphadenopathy:    She has no cervical adenopathy.  Skin: Skin is warm and dry.  Nursing note and vitals reviewed.         Assessment & Plan:  Sinusitis Antibiotics prescribed Patient was seen today for upper respiratory illness. It is felt that the patient is dealing with sinusitis. Antibiotics were prescribed today. Importance of compliance with medication was discussed. Symptoms should gradually resolve over the course of the next several days. If high fevers, progressive illness, difficulty breathing, worsening condition or failure for symptoms to improve over the next several days then the patient is to follow-up. If any emergent conditions the patient  is to follow-up in the emergency department otherwise to follow-up in the office.

## 2017-11-23 ENCOUNTER — Other Ambulatory Visit: Payer: Self-pay | Admitting: *Deleted

## 2017-11-23 MED ORDER — AMOXICILLIN-POT CLAVULANATE 875-125 MG PO TABS: 1.0000 | ORAL_TABLET | Freq: Two times a day (BID) | ORAL | 0 refills | Status: DC

## 2017-12-04 ENCOUNTER — Ambulatory Visit: Payer: BC Managed Care – PPO | Admitting: Family Medicine

## 2018-01-16 ENCOUNTER — Other Ambulatory Visit: Payer: Self-pay | Admitting: Adult Health

## 2018-01-16 DIAGNOSIS — Z1231 Encounter for screening mammogram for malignant neoplasm of breast: Secondary | ICD-10-CM

## 2018-01-22 ENCOUNTER — Encounter (HOSPITAL_COMMUNITY): Payer: Self-pay

## 2018-01-22 ENCOUNTER — Ambulatory Visit (HOSPITAL_COMMUNITY)
Admission: RE | Admit: 2018-01-22 | Discharge: 2018-01-22 | Disposition: A | Discharge status: Home / Self Care | Payer: BC Managed Care – PPO | Source: Ambulatory Visit | Attending: Adult Health | Admitting: Adult Health

## 2018-01-22 DIAGNOSIS — Z1231 Encounter for screening mammogram for malignant neoplasm of breast: Secondary | ICD-10-CM | POA: Diagnosis present

## 2018-04-03 ENCOUNTER — Other Ambulatory Visit: Payer: Self-pay | Admitting: Family Medicine

## 2018-05-07 ENCOUNTER — Encounter: Payer: Self-pay | Admitting: Family Medicine

## 2018-05-07 ENCOUNTER — Ambulatory Visit: Payer: BC Managed Care – PPO | Admitting: Family Medicine

## 2018-05-07 VITALS — BP 110/62 | Ht 65.75 in | Wt 176.0 lb

## 2018-05-07 DIAGNOSIS — F411 Generalized anxiety disorder: Secondary | ICD-10-CM | POA: Insufficient documentation

## 2018-05-07 DIAGNOSIS — G4700 Insomnia, unspecified: Secondary | ICD-10-CM | POA: Diagnosis not present

## 2018-05-07 DIAGNOSIS — I10 Essential (primary) hypertension: Secondary | ICD-10-CM | POA: Diagnosis not present

## 2018-05-07 MED ORDER — DEXLANSOPRAZOLE 60 MG PO CPDR: 60.0000 mg | DELAYED_RELEASE_CAPSULE | Freq: Every day | ORAL | 11 refills | Status: DC

## 2018-05-07 MED ORDER — ALPRAZOLAM 1 MG PO TABS: 1.0000 mg | ORAL_TABLET | Freq: Two times a day (BID) | ORAL | 5 refills | Status: DC | PRN

## 2018-05-07 MED ORDER — TRAZODONE HCL 50 MG PO TABS: 50.0000 mg | ORAL_TABLET | Freq: Every evening | ORAL | 3 refills | Status: DC | PRN

## 2018-05-07 MED ORDER — ESCITALOPRAM OXALATE 20 MG PO TABS: 20.0000 mg | ORAL_TABLET | Freq: Every day | ORAL | 3 refills | Status: DC

## 2018-05-07 NOTE — Progress Notes (Signed)
   Subjective:    Patient ID: Robin Dixon, female    DOB: 10/31/1965, 52 y.o.   MRN: [id number]  Hypertension  This is a chronic problem. The current episode started more than 1 year ago. The problem has been gradually improving since onset. The problem is controlled. Associated symptoms include anxiety. Pertinent negatives include no chest pain, headaches, neck pain, orthopnea or shortness of breath. There are no associated agents to hypertension. Risk factors for coronary artery disease include dyslipidemia. Past treatments include calcium channel blockers. The current treatment provides significant improvement. There are no compliance problems.  There is no history of angina, kidney disease or CVA. There is no history of chronic renal disease.   Patient is here today to follow up on her chronic health issues. Patient does have chronic anxiety related issues Lexapro helps her function Xanax also helps her denies abusing it not depressed not suicidal  Reflux under good control with medication watch his diet closely stay physically active   Pt takes amlodipine 5 mg daily and she takes lexapro 20 mg daily.    Review of Systems  Constitutional: Negative for activity change, fatigue and fever.  HENT: Negative for congestion and rhinorrhea.   Respiratory: Negative for cough, chest tightness and shortness of breath.   Cardiovascular: Negative for chest pain, orthopnea and leg swelling.  Gastrointestinal: Negative for abdominal pain and nausea.  Musculoskeletal: Negative for neck pain.  Skin: Negative for color change.  Neurological: Negative for dizziness and headaches.  Psychiatric/Behavioral: Negative for agitation and behavioral problems.       Objective:   Physical Exam  Constitutional: She appears well-nourished. No distress.  HENT:  Head: Normocephalic and atraumatic.  Eyes: Right eye exhibits no discharge. Left eye exhibits no discharge.  Neck: No tracheal deviation present.    Cardiovascular: Normal rate, regular rhythm and normal heart sounds.  No murmur heard. Pulmonary/Chest: Effort normal and breath sounds normal. No respiratory distress.  Musculoskeletal: She exhibits no edema.  Lymphadenopathy:    She has no cervical adenopathy.  Neurological: She is alert. Coordination normal.  Skin: Skin is warm and dry.  Psychiatric: She has a normal mood and affect. Her behavior is normal.  Vitals reviewed.         Assessment & Plan:  HTN- Patient was seen today as part of a visit regarding hypertension. The importance of healthy diet and regular physical activity was discussed. The importance of compliance with medications discussed.  Ideal goal is to keep blood pressure low elevated levels certainly below 140/90 when possible.  The patient was counseled that keeping blood pressure under control lessen his risk of complications.  The importance of regular follow-ups was discussed with the patient.  Low-salt diet such as DASH recommended.  Regular physical activity was recommended as well.  Patient was advised to keep regular follow-ups.   Stress levels steady tolerating medicine well denies Xanax misuse  Follow-up 6 months lab work closer to the time she is here

## 2018-05-07 NOTE — Patient Instructions (Signed)
DASH Eating Plan DASH stands for "Dietary Approaches to Stop Hypertension." The DASH eating plan is a healthy eating plan that has been shown to reduce high blood pressure (hypertension). It may also reduce your risk for type 2 diabetes, heart disease, and stroke. The DASH eating plan may also help with weight loss. What are tips for following this plan? General guidelines  Avoid eating more than 2,300 mg (milligrams) of salt (sodium) a day. If you have hypertension, you may need to reduce your sodium intake to 1,500 mg a day.  Limit alcohol intake to no more than 1 drink a day for nonpregnant women and 2 drinks a day for men. One drink equals 12 oz of beer, 5 oz of wine, or 1 oz of hard liquor.  Work with your health care provider to maintain a healthy body weight or to lose weight. Ask what an ideal weight is for you.  Get at least 30 minutes of exercise that causes your heart to beat faster (aerobic exercise) most days of the week. Activities may include walking, swimming, or biking.  Work with your health care provider or diet and nutrition specialist (dietitian) to adjust your eating plan to your individual calorie needs. Reading food labels  Check food labels for the amount of sodium per serving. Choose foods with less than 5 percent of the Daily Value of sodium. Generally, foods with less than 300 mg of sodium per serving fit into this eating plan.  To find whole grains, look for the word "whole" as the first word in the ingredient list. Shopping  Buy products labeled as "low-sodium" or "no salt added."  Buy fresh foods. Avoid canned foods and premade or frozen meals. Cooking  Avoid adding salt when cooking. Use salt-free seasonings or herbs instead of table salt or sea salt. Check with your health care provider or pharmacist before using salt substitutes.  Do not fry foods. Cook foods using healthy methods such as baking, boiling, grilling, and broiling instead.  Cook with  heart-healthy oils, such as olive, canola, soybean, or sunflower oil. Meal planning   Eat a balanced diet that includes: ? 5 or more servings of fruits and vegetables each day. At each meal, try to fill half of your plate with fruits and vegetables. ? Up to 6-8 servings of whole grains each day. ? Less than 6 oz of lean meat, poultry, or fish each day. A 3-oz serving of meat is about the same size as a deck of cards. One egg equals 1 oz. ? 2 servings of low-fat dairy each day. ? A serving of nuts, seeds, or beans 5 times each week. ? Heart-healthy fats. Healthy fats called Omega-3 fatty acids are found in foods such as flaxseeds and coldwater fish, like sardines, salmon, and mackerel.  Limit how much you eat of the following: ? Canned or prepackaged foods. ? Food that is high in trans fat, such as fried foods. ? Food that is high in saturated fat, such as fatty meat. ? Sweets, desserts, sugary drinks, and other foods with added sugar. ? Full-fat dairy products.  Do not salt foods before eating.  Try to eat at least 2 vegetarian meals each week.  Eat more home-cooked food and less restaurant, buffet, and fast food.  When eating at a restaurant, ask that your food be prepared with less salt or no salt, if possible. What foods are recommended? The items listed may not be a complete list. Talk with your dietitian about what   dietary choices are best for you. Grains Whole-grain or whole-wheat bread. Whole-grain or whole-wheat pasta. Brown rice. Oatmeal. Quinoa. Bulgur. Whole-grain and low-sodium cereals. Pita bread. Low-fat, low-sodium crackers. Whole-wheat flour tortillas. Vegetables Fresh or frozen vegetables (raw, steamed, roasted, or grilled). Low-sodium or reduced-sodium tomato and vegetable juice. Low-sodium or reduced-sodium tomato sauce and tomato paste. Low-sodium or reduced-sodium canned vegetables. Fruits All fresh, dried, or frozen fruit. Canned fruit in natural juice (without  added sugar). Meat and other protein foods Skinless chicken or turkey. Ground chicken or turkey. Pork with fat trimmed off. Fish and seafood. Egg whites. Dried beans, peas, or lentils. Unsalted nuts, nut butters, and seeds. Unsalted canned beans. Lean cuts of beef with fat trimmed off. Low-sodium, lean deli meat. Dairy Low-fat (1%) or fat-free (skim) milk. Fat-free, low-fat, or reduced-fat cheeses. Nonfat, low-sodium ricotta or cottage cheese. Low-fat or nonfat yogurt. Low-fat, low-sodium cheese. Fats and oils Soft margarine without trans fats. Vegetable oil. Low-fat, reduced-fat, or light mayonnaise and salad dressings (reduced-sodium). Canola, safflower, olive, soybean, and sunflower oils. Avocado. Seasoning and other foods Herbs. Spices. Seasoning mixes without salt. Unsalted popcorn and pretzels. Fat-free sweets. What foods are not recommended? The items listed may not be a complete list. Talk with your dietitian about what dietary choices are best for you. Grains Baked goods made with fat, such as croissants, muffins, or some breads. Dry pasta or rice meal packs. Vegetables Creamed or fried vegetables. Vegetables in a cheese sauce. Regular canned vegetables (not low-sodium or reduced-sodium). Regular canned tomato sauce and paste (not low-sodium or reduced-sodium). Regular tomato and vegetable juice (not low-sodium or reduced-sodium). Pickles. Olives. Fruits Canned fruit in a light or heavy syrup. Fried fruit. Fruit in cream or butter sauce. Meat and other protein foods Fatty cuts of meat. Ribs. Fried meat. Bacon. Sausage. Bologna and other processed lunch meats. Salami. Fatback. Hotdogs. Bratwurst. Salted nuts and seeds. Canned beans with added salt. Canned or smoked fish. Whole eggs or egg yolks. Chicken or turkey with skin. Dairy Whole or 2% milk, cream, and half-and-half. Whole or full-fat cream cheese. Whole-fat or sweetened yogurt. Full-fat cheese. Nondairy creamers. Whipped toppings.  Processed cheese and cheese spreads. Fats and oils Butter. Stick margarine. Lard. Shortening. Ghee. Bacon fat. Tropical oils, such as coconut, palm kernel, or palm oil. Seasoning and other foods Salted popcorn and pretzels. Onion salt, garlic salt, seasoned salt, table salt, and sea salt. Worcestershire sauce. Tartar sauce. Barbecue sauce. Teriyaki sauce. Soy sauce, including reduced-sodium. Steak sauce. Canned and packaged gravies. Fish sauce. Oyster sauce. Cocktail sauce. Horseradish that you find on the shelf. Ketchup. Mustard. Meat flavorings and tenderizers. Bouillon cubes. Hot sauce and Tabasco sauce. Premade or packaged marinades. Premade or packaged taco seasonings. Relishes. Regular salad dressings. Where to find more information:  National Heart, Lung, and Blood Institute: www.nhlbi.nih.gov  American Heart Association: www.heart.org Summary  The DASH eating plan is a healthy eating plan that has been shown to reduce high blood pressure (hypertension). It may also reduce your risk for type 2 diabetes, heart disease, and stroke.  With the DASH eating plan, you should limit salt (sodium) intake to 2,300 mg a day. If you have hypertension, you may need to reduce your sodium intake to 1,500 mg a day.  When on the DASH eating plan, aim to eat more fresh fruits and vegetables, whole grains, lean proteins, low-fat dairy, and heart-healthy fats.  Work with your health care provider or diet and nutrition specialist (dietitian) to adjust your eating plan to your individual   calorie needs. This information is not intended to replace advice given to you by your health care provider. Make sure you discuss any questions you have with your health care provider. Document Released: 08/25/2011 Document Revised: 08/29/2016 Document Reviewed: 08/29/2016 Elsevier Interactive Patient Education  2018 Else[employer] Inc.  

## 2018-05-29 ENCOUNTER — Telehealth: Payer: Self-pay | Admitting: Family Medicine

## 2018-05-29 ENCOUNTER — Encounter: Payer: Self-pay | Admitting: Family Medicine

## 2018-05-30 ENCOUNTER — Encounter: Payer: Self-pay | Admitting: Family Medicine

## 2018-05-30 NOTE — Telephone Encounter (Signed)
Made appointment instead taking a message.

## 2018-05-31 ENCOUNTER — Ambulatory Visit: Payer: BC Managed Care – PPO | Admitting: Family Medicine

## 2018-06-05 ENCOUNTER — Ambulatory Visit: Payer: BC Managed Care – PPO | Admitting: Family Medicine

## 2018-06-05 ENCOUNTER — Encounter: Payer: Self-pay | Admitting: Family Medicine

## 2018-06-05 VITALS — BP 132/88 | Temp 98.7°F | Ht 65.0 in | Wt 176.0 lb

## 2018-06-05 DIAGNOSIS — R197 Diarrhea, unspecified: Secondary | ICD-10-CM

## 2018-06-05 NOTE — Progress Notes (Signed)
   Subjective:    Patient ID: Robin Dixon, female    DOB: [birth year], 52 y.o.   MRN: [id number]  Diarrhea   This is a new problem. The current episode started 1 to 4 weeks ago. Episode frequency: everytime after eating. The stool consistency is described as mucous and watery. The patient states that diarrhea awakens her from sleep. Associated symptoms include abdominal pain. Pertinent negatives include no chills, fever or vomiting. Treatments tried: immodium. The treatment provided no relief.   Foster parenting: 54 y.o and 52 y.o. For about 1 year, becoming stressful.  Having diarrhea x 3 weeks, described as mucous, also noting pieces of undigested food in stool as well, most of the time watery and mucous. Reports diarrhea occurs after eating, sometimes 3-4 x. Have had episodes of some more solid stool, not quite back to normal, but then goes back to diarrhea.  Reports severe abdominal cramping prior to diarrhea. No known fever or chills. No N/V. No one at home sick with diarrhea either. No blood in stool. Imodium and pepto-bismol not helpful. Does sometimes wake up at night with diarrhea.  Denies recent travel, No unusual water sources, no recent abx use.  Surgical hx: cholecystectomy   Review of Systems  Constitutional: Negative for chills and fever.  Gastrointestinal: Positive for abdominal pain and diarrhea. Negative for blood in stool, nausea and vomiting.  Psychiatric/Behavioral:       Reports stress  All other systems reviewed and are negative.      Objective:   Physical Exam  Constitutional: She is oriented to person, place, and time. She appears well-developed and well-nourished. No distress.  HENT:  Head: Normocephalic and atraumatic.  Eyes: Right eye exhibits no discharge. Left eye exhibits no discharge.  Neck: Neck supple.  Cardiovascular: Normal rate, regular rhythm and normal heart sounds.  Pulmonary/Chest: Effort normal and breath sounds normal. No respiratory  distress.  Abdominal: Soft. She exhibits no distension and no mass. Bowel sounds are increased. There is no tenderness.  Neurological: She is alert and oriented to person, place, and time.  Skin: Skin is warm and dry.  Psychiatric: She has a normal mood and affect.  Nursing note and vitals reviewed.     Assessment & Plan:  Diarrhea of presumed infectious origin  Given pt hx concerned about possible bacterial process or potential for an inflammatory bowel disease. Labs ordered: CBC, Met 7, lipase, sed rate, and liver enzymes, stool culture, stool c. Diff, and stool O & P. May continue to use imodium prn. Will f/u with patient based on results of lab work, may need referral to GI for further work up.   I do not feel that this is due to underlying stress I think more than likely it is a secondary infection Patient does not appear toxic but we do need to run some tests As attending physician to this patient visit, this patient was seen in conjunction with the nurse practitioner.  The history,physical and treatment plan was reviewed with the nurse practitioner and pertinent findings were verified with the patient.  Also the treatment plan was reviewed with the patient while they were present. SAL

## 2018-06-06 LAB — HEPATIC FUNCTION PANEL
ALBUMIN: 5 g/dL (ref 3.5–5.5)
ALT: 21 IU/L (ref 0–32)
AST: 24 IU/L (ref 0–40)
Alkaline Phosphatase: 79 IU/L (ref 39–117)
BILIRUBIN, DIRECT: 0.11 mg/dL (ref 0.00–0.40)
Bilirubin Total: 0.3 mg/dL (ref 0.0–1.2)
Total Protein: 7.8 g/dL (ref 6.0–8.5)

## 2018-06-06 LAB — BASIC METABOLIC PANEL
BUN/Creatinine Ratio: 23 (ref 9–23)
BUN: 18 mg/dL (ref 6–24)
CALCIUM: 10.1 mg/dL (ref 8.7–10.2)
CO2: 26 mmol/L (ref 20–29)
Chloride: 97 mmol/L (ref 96–106)
Creatinine, Ser: 0.78 mg/dL (ref 0.57–1.00)
GFR, EST AFRICAN AMERICAN: 100 mL/min/{1.73_m2} (ref >59)
GFR, EST NON AFRICAN AMERICAN: 86 mL/min/{1.73_m2} (ref >59)
Glucose: 86 mg/dL (ref 65–99)
Potassium: 4.6 mmol/L (ref 3.5–5.2)
Sodium: 140 mmol/L (ref 134–144)

## 2018-06-06 LAB — CBC WITH DIFFERENTIAL/PLATELET
Basophils Absolute: 0 10*3/uL (ref 0.0–0.2)
Basos: 1 %
EOS (ABSOLUTE): 0.1 10*3/uL (ref 0.0–0.4)
EOS: 1 %
HEMATOCRIT: 40.2 % (ref 34.0–46.6)
HEMOGLOBIN: 13.6 g/dL (ref 11.1–15.9)
IMMATURE GRANS (ABS): 0 10*3/uL (ref 0.0–0.1)
IMMATURE GRANULOCYTES: 1 %
Lymphocytes Absolute: 2.2 10*3/uL (ref 0.7–3.1)
Lymphs: 29 %
MCH: 32 pg (ref 26.6–33.0)
MCHC: 33.8 g/dL (ref 31.5–35.7)
MCV: 95 fL (ref 79–97)
MONOCYTES: 8 %
Monocytes Absolute: 0.6 10*3/uL (ref 0.1–0.9)
NEUTROS PCT: 60 %
Neutrophils Absolute: 4.6 10*3/uL (ref 1.4–7.0)
Platelets: 280 10*3/uL (ref 150–450)
RBC: 4.25 x10E6/uL (ref 3.77–5.28)
RDW: 13.6 % (ref 12.3–15.4)
WBC: 7.6 10*3/uL (ref 3.4–10.8)

## 2018-06-06 LAB — SEDIMENTATION RATE: Sed Rate: 10 mm/hr (ref 0–40)

## 2018-06-06 LAB — LIPASE: Lipase: 32 U/L (ref 14–72)

## 2018-06-08 LAB — CLOSTRIDIUM DIFFICILE BY PCR: CDIFFPCR: NEGATIVE

## 2018-06-11 LAB — OVA AND PARASITE EXAMINATION

## 2018-06-12 ENCOUNTER — Encounter: Payer: Self-pay | Admitting: Family Medicine

## 2018-06-12 DIAGNOSIS — R197 Diarrhea, unspecified: Secondary | ICD-10-CM

## 2018-06-12 LAB — STOOL CULTURE: E coli, Shiga toxin Assay: NEGATIVE

## 2018-06-12 MED ORDER — DICYCLOMINE HCL 10 MG PO CAPS: ORAL_CAPSULE | ORAL | 1 refills | Status: DC

## 2018-06-12 NOTE — Addendum Note (Signed)
Addended by: Meredith LeedsSUTTON, Avilyn Virtue L on: 06/12/2018 10:31 AM   Modules accepted: Orders

## 2018-06-12 NOTE — Telephone Encounter (Signed)
Nurses-we are still awaiting the stool culture results this may take a few more days I recommend dicyclomine 10 mg 1 3 times daily as needed #30 with 1 refill I also recommend gastroenterology referral [facility] she has been seen there before.  Needs further evaluation.  Obviously if the culture results shows the issue we can take care of it but if the culture results are normal she will need further evaluation by GI Please send message to the patient regarding this Go ahead and send in the medication as well Let the patient know that as soon as we get culture results we will also touch base regarding them

## 2018-06-14 ENCOUNTER — Encounter: Payer: Self-pay | Admitting: Gastroenterology

## 2018-08-29 ENCOUNTER — Other Ambulatory Visit: Payer: Self-pay

## 2018-08-29 ENCOUNTER — Ambulatory Visit: Payer: BC Managed Care – PPO | Admitting: Gastroenterology

## 2018-08-29 ENCOUNTER — Other Ambulatory Visit: Payer: Self-pay | Admitting: Gastroenterology

## 2018-08-29 ENCOUNTER — Encounter: Payer: Self-pay | Admitting: Gastroenterology

## 2018-08-29 ENCOUNTER — Telehealth: Payer: Self-pay

## 2018-08-29 ENCOUNTER — Ambulatory Visit (HOSPITAL_COMMUNITY)
Admission: RE | Admit: 2018-08-29 | Discharge: 2018-08-29 | Disposition: A | Discharge status: Home / Self Care | Payer: BC Managed Care – PPO | Source: Ambulatory Visit | Attending: Gastroenterology | Admitting: Gastroenterology

## 2018-08-29 DIAGNOSIS — R1011 Right upper quadrant pain: Secondary | ICD-10-CM | POA: Insufficient documentation

## 2018-08-29 LAB — POCT I-STAT CREATININE: CREATININE: 0.7 mg/dL (ref 0.44–1.00)

## 2018-08-29 MED ORDER — DICYCLOMINE HCL 10 MG PO CAPS: ORAL_CAPSULE | ORAL | 5 refills | Status: DC

## 2018-08-29 MED ORDER — IOPAMIDOL (ISOVUE-300) INJECTION 61%
100.0000 mL | Freq: Once | INTRAVENOUS | Status: AC | PRN
  Administered 2018-08-29: 100 mL via INTRAVENOUS

## 2018-08-29 NOTE — Addendum Note (Signed)
Addended by: West BaliFIELDS, Lechelle Wrigley L on: 08/29/2018 03:19 PM   Modules accepted: Orders

## 2018-08-29 NOTE — Progress Notes (Signed)
Subjective:    Patient ID: Robin Dixon, female    DOB: 10-02-1965, 52 y.o.   MRN: [id number]  [name], MD  HPI CHANGE IN BOWEL HABITS: CONSTIPATION ALLHER LIIFE UNTIL DIARRHEA STARTED FOR A COUPLE OF MOS(SINCE AUG 2019). THOUGHT IT WAS STRESS: TWO FOSTER CHILDREN(AGE 51,6) AND ONE AGE 30. BMs: WAS CONSTANT/ACCIDENTS AT NIGHT AND NOW NOT DOING THAT. DIARRHEA PART IS BETTER. MAY BE DOING YARD WORK AND HAVE SEEPAGE AND DIDN'T SEE IT. LOOKED LIKE FOOD PIECES AND MORE LIKE THROW UP.  ASSOCIATED WITH R PAIN/FLANK. TODAY BEEN TWICE # 4 BUT SMALLER PIECES. TRIGGERS:?STRESS. WORRIED TYPE PERSON. SUN THOUGHT DOG WAS DYING. TOOK HER TO THE VET. NOW ON ABX AND MEDS TO PRODUCE RBCs. MOST OF THE TIMES SHE IS HOT. NEW MEDS: NO. OTC MEDS: NO. MILK: VERY LITTLE. CHEESE: VERY LITTLE> ICE CREAM: RARE. EAT OUT: NOT THAT OFTEN. NOTHING MADE  IT BETTER. TRIED IMODIUM(NO HELP). FRIED FOOD: NOT REALLY. YESTERDAY: POP TART, FRIED FOOD [place], SUPPER: CHICKEN TENDER AND CHICKEN SANDWICH WITH MAYO/LETTUCE/TOMATO. PAINS IN BOTH LOWER RIGHT BACK.ABDOMEN(ACHY) GETS BETTER AFTER BM. NAUSEATED OFF AND ON. QUIT SMOKING: ~25 YRS(1 PK/DAY FOR ~15 YRS.ALWAYS HAS HAD TRACE OF BLOOD IN HER URINE. NO DSYURIA. MOM HAD KIDNEY STONES. SHE HAD KIDNEY STONES YEARS AGO.  PT DENIES FEVER, CHILLS, HEMATOCHEZIA, HEMATEMESIS, vomiting, melena, CHEST PAIN, SHORTNESS OF BREATH, CHANGE IN BOWEL IN HABITS, constipation, problems swallowing, problems with sedation, OR heartburn or indigestion.  Past Medical History:  Diagnosis Date  . Anxiety 03/29/2013  . Carpal tunnel syndrome 07/02/2015   Right  . History of neck injury   . Hot flashes 03/29/2013  . Hypertension   . Mental disorder    anxiety depression  . PMB (postmenopausal bleeding) 06/12/2013   US WNL   Past Surgical History:  Procedure Laterality Date  . CARPAL TUNNEL RELEASE Right 09/23/2015   Procedure: RIGHT CARPAL TUNNEL RELEASE;  Surgeon: Vickki Hearing, MD;   Location: [facility];  Service: Orthopedics;  Laterality: Right;  . CHOLECYSTECTOMY     [place]  . COLONOSCOPY N/A 12/27/2013   Procedure: COLONOSCOPY;  Surgeon: West Bali, MD;  Location: [facility];  Service: Endoscopy;  Laterality: N/A;  1:45  . ESOPHAGOGASTRODUODENOSCOPY N/A 12/27/2013   Procedure: ESOPHAGOGASTRODUODENOSCOPY (EGD);  Surgeon: West Bali, MD;  Location: [facility];  Service: Endoscopy;  Laterality: N/A;   Allergies  Allergen Reactions  . Keflex [Cephalexin] Nausea And Vomiting    vomiting   Current Outpatient Medications  Medication Sig    . ALPRAZolam (XANAX) 1 MG tablet Take 1 tablet (1 mg total) by mouth 2 (two) times daily as needed.    Marland Kitchen amLODipine (NORVASC) 5 MG tablet TAKE 1 TABLET BY MOUTH ONCE DAILY    . dexlansoprazole (DEXILANT) 60 MG capsule Take 1 capsule (60 mg total) by mouth daily.    Marland Kitchen dicyclomine (BENTYL) 10 MG capsule One capsule po TID Prn    . escitalopram (LEXAPRO) 20 MG tablet Take 1 tablet (20 mg total) by mouth daily.    . traZODone (DESYREL) 50 MG tablet Take 1 tablet (50 mg total) by mouth at bedtime as needed for sleep.     Family History  Problem Relation Age of Onset  . Diabetes Mother   . Hypertension Mother   . Stroke Mother   . Cancer Mother        blood  . Kidney Stones Mother   . COPD Sister   .  Hypertension Sister   . Cancer Brother        melanoma  . Hypertension Sister   . Colon cancer Neg Hx   . Colon polyps Neg Hx    Social History   Socioeconomic History  . Marital status: Single    Spouse name: Not on file  . Number of children: Not on file  . Years of education: Not on file  . Highest education level: Not on file  Occupational History  . Not on file  Social Needs  . Financial resource strain: Not on file  . Food insecurity:    Worry: Not on file    Inability: Not on file  . Transportation needs:    Medical: Not on file    Non-medical: Not on file  Tobacco Use  . Smoking status:  Former Smoker    Years: 15.00    Types: Cigarettes    Last attempt to quit: 02/22/1998    Years since quitting: 20.5  . Smokeless tobacco: Never Used  . Tobacco comment: quit years ago  Substance and Sexual Activity  . Alcohol use: Yes    Comment: occ  . Drug use: No  . Sexual activity: Yes    Birth control/protection: Post-menopausal  Lifestyle  . Physical activity:    Days per week: Not on file    Minutes per session: Not on file  . Stress: Not on file  Relationships  . Social connections:    Talks on phone: Not on file    Gets together: Not on file    Attends religious service: Not on file    Active member of club or organization: Not on file    Attends meetings of clubs or organizations: Not on file    Relationship status: Not on file  Other Topics Concern  . Not on file  Social History Narrative   HOBBIES: BankerCOACH BASKETBALL, SOCCER, DANCE AND PLAYS, WORKS PUZZLES. CARES FOR CHICKENS, GUINEAS, A GOATS, A PIGS, A RABBITS.   MARRIED SINCE 2015. OCCASIONAL ETOH.      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   Review of Systems PER HPI OTHERWISE ALL SYSTEMS ARE NEGATIVE.    Objective:   Physical Exam  Constitutional: She is oriented to person, place, and time. She appears well-developed and well-nourished. No distress.  HENT:  Head: Normocephalic and atraumatic.  Mouth/Throat: Oropharynx is clear and moist. No oropharyngeal exudate.  Eyes: Pupils are equal, round, and reactive to light. No scleral icterus.  Neck: Normal range of motion. Neck supple.  Cardiovascular: Normal rate, regular rhythm and normal heart sounds.  Pulmonary/Chest: Effort normal and breath sounds normal. No respiratory distress.  Abdominal: Soft. Bowel sounds are normal. She exhibits no distension. There is no tenderness.  Musculoskeletal: She exhibits no edema.  Lymphadenopathy:    She has no cervical adenopathy.  Neurological: She is  alert and oriented to person, place, and time.  Psychiatric: She has a normal mood and affect.  Vitals reviewed.     Assessment & Plan:

## 2018-08-29 NOTE — Telephone Encounter (Signed)
REVIEWED-NO ADDITIONAL RECOMMENDATIONS. 

## 2018-08-29 NOTE — Patient Instructions (Signed)
DRINK WATER TO KEEP YOUR URINE LIGHT YELLOW.  FOLLOW A LACTOSE FREE DIET. SEE INFO BELOW.   TO CONTROL DIARRHEA, TAKE DICYCLOMINE 10 MG TABLETS ONE OR TWO 30 MINUTES PRIOR MEALS UP TO THREE TIMES A DAY. IT MAY CAUSE DROWSINESS, DRY EYES/MOUTH, BLURRY VISION, OR DIFFICULTY URINATING.  COMPLETE LABS AND CT SCAN.  FOLLOW UP IN 3 MOS. WITH DR. Filippo Puls.   Lactose Free Diet Lactose is a carbohydrate that is found mainly in milk and milk products, as well as in foods with added milk or whey. Lactose must be digested by the enzyme in order to be used by the body. Lactose intolerance occurs when there is a shortage of lactase. When your body is not able to digest lactose, you may feel sick to your stomach (nausea), bloating, cramping, gas and diarrhea.  There are many dairy products that may be tolerated better than milk by some people:  The use of cultured dairy products such as yogurt, buttermilk, cottage cheese, and sweet acidophilus milk (Kefir) for lactase-deficient individuals is usually well tolerated. This is because the healthy bacteria help digest lactose.   Lactose-hydrolyzed milk (Lactaid) contains 40-90% less lactose than milk and may also be well tolerated.    SPECIAL NOTES  Lactose is a carbohydrates. The major food source is dairy products. Reading food labels is important. Many products contain lactose even when they are not made from milk. Look for the following words: whey, milk solids, dry milk solids, nonfat dry milk powder. Typical sources of lactose other than dairy products include breads, candies, cold cuts, prepared and processed foods, and commercial sauces and gravies.   All foods must be prepared without milk, cream, or other dairy foods.   Soy milk and lactose-free supplements (LACTASE) may be used as an alternative to milk.   FOOD GROUP ALLOWED/RECOMMENDED AVOID/USE SPARINGLY  BREADS / STARCHES 4 servings or more* Breads and rolls made without milk. [place],  [place], or [place] bread. Breads and rolls that contain milk. Prepared mixes such as muffins, biscuits, waffles, pancakes. Sweet rolls, donuts, [place] toast (if made with milk or lactose).  Crackers: Soda crackers, graham crackers. Any crackers prepared without lactose. Zwieback crackers, corn curls, or any that contain lactose.  Cereals: Cooked or dry cereals prepared without lactose (read labels). Cooked or dry cereals prepared with lactose (read labels). Total, Cocoa Krispies. Special K.  Potatoes / Pasta / Rice: Any prepared without milk or lactose. Popcorn. Instant potatoes, frozen [place] fries, scalloped or au gratin potatoes.  VEGETABLES 2 servings or more Fresh, frozen, and canned vegetables. Creamed or breaded vegetables. Vegetables in a cheese sauce or with lactose-containing margarines.  FRUIT 2 servings or more All fresh, canned, or frozen fruits that are not processed with lactose. Any canned or frozen fruits processed with lactose.  MEAT & SUBSTITUTES 2 servings or more (4 to 6 oz. total per day) Plain beef, chicken, fish, [place], lamb, veal, pork, or ham. Kosher prepared meat products. Strained or junior meats that do not contain milk. Eggs, soy meat substitutes, nuts. Scrambled eggs, omelets, and souffles that contain milk. Creamed or breaded meat, fish, or fowl. Sausage products such as wieners, liver sausage, or cold cuts that contain milk solids. Cheese, cottage cheese, or cheese spreads.  MILK None. (See "BEVERAGES" for milk substitutes. See "DESSERTS" for ice cream and frozen desserts.) Milk (whole, 2%, skim, or chocolate). Evaporated, powdered, or condensed milk; malted milk.  SOUPS & COMBINATION FOODS Bouillon, broth, vegetable soups, clear soups, consomms. Homemade soups  made with allowed ingredients. Combination or prepared foods that do not contain milk or milk products (read labels). Cream soups, chowders, commercially prepared soups containing lactose. Macaroni and cheese,  pizza. Combination or prepared foods that contain milk or milk products.  DESSERTS & SWEETS In moderation Water and fruit ices; gelatin; angel food cake. Homemade cookies, pies, or cakes made from allowed ingredients. Pudding (if made with water or a milk substitute). Lactose-free tofu desserts. Sugar, honey, corn syrup, jam, jelly; marmalade; molasses (beet sugar); Pure sugar candy; marshmallows. Ice cream, ice milk, sherbet, custard, pudding, frozen yogurt. Commercial cake and cookie mixes. Desserts that contain chocolate. Pie crust made with milk-containing margarine; reduced-calorie desserts made with a sugar substitute that contains lactose. Toffee, peppermint, butterscotch, chocolate, caramels.  FATS & OILS In moderation Butter (as tolerated; contains very small amounts of lactose). Margarines and dressings that do not contain milk, Vegetable oils, shortening, Miracle Whip, mayonnaise, nondairy cream & whipped toppings without lactose or milk solids added (examples: Coffee Rich, Carnation Coffeemate, Rich's Whipped Topping, PolyRich). Tomasa BlaseBacon. Margarines and salad dressings containing milk; cream, cream cheese; peanut butter with added milk solids, sour cream, chip dips, made with sour cream.  BEVERAGES Carbonated drinks; tea; coffee and freeze-dried coffee; some instant coffees (check labels). Fruit drinks; fruit and vegetable juice; Rice or Soy milk. Ovaltine, hot chocolate. Some cocoas; some instant coffees; instant iced teas; powdered fruit drinks (read labels).   CONDIMENTS / MISCELLANEOUS Soy sauce, carob powder, olives, gravy made with water, baker's cocoa, pickles, pure seasonings and spices, wine, pure monosodium glutamate, catsup, mustard. Some chewing gums, chocolate, some cocoas. Certain antibiotics and vitamin / mineral preparations. Spice blends if they contain milk products. MSG extender. Artificial sweeteners that contain lactose such as Equal (Nutra-Sweet) and Sweet 'n Low. Some  nondairy creamers (read labels).   SAMPLE MENU*  Breakfast   Orange Juice.  Banana.   Bran flakes.   Nondairy Creamer.  Vienna Bread (toasted).   Butter or milk-free margarine.   Coffee or tea.    Noon Meal   Chicken Breast.  Rice.   Green beans.   Butter or milk-free margarine.  Fresh melon.   Coffee or tea.    Evening Meal   Roast Beef.  Baked potato.   Butter or milk-free margarine.   Broccoli.   Lettuce salad with vinegar and oil dressing.  [employer] food cake.   Coffee or tea.

## 2018-08-29 NOTE — Assessment & Plan Note (Addendum)
Associated with right flank pain and diarrhea. DIFFERENTIAL DIAGNOSIS INCLUDES:  IBS-D EXACERBATED BY STRESS/DIET, SUBACUTE UTI/KIDNEY STONES, PANCREATIC INSUFFICIENCY, LACTOSE INTOLERANCE, OR BILE SALT DIARRHEA, LESS LIKELY MICROSCOPIC COLITIS. LESS LIKELY CELIAC SPRUE, OR IBD.  DRINK WATER TO KEEP YOUR URINE LIGHT YELLOW. COMPLETE CT SCAN AND LABS. AVOID DIARY.  HANDOUT GIVEN. BENTYL WHEN NEEDED. FOLLOW UP IN 3 MOS.

## 2018-08-29 NOTE — Progress Notes (Signed)
Pt is aware.  

## 2018-08-29 NOTE — Telephone Encounter (Signed)
T/C from [facility] in Radiology, STAT CT report in epic. I have informed Dr. Darrick PennaFields.

## 2018-08-29 NOTE — Progress Notes (Signed)
cc'ed to pcp °

## 2018-08-30 LAB — UA/M W/RFLX CULTURE, ROUTINE
Bilirubin, UA: NEGATIVE
GLUCOSE, UA: NEGATIVE
Ketones, UA: NEGATIVE
Leukocytes, UA: NEGATIVE
Nitrite, UA: NEGATIVE
PROTEIN UA: NEGATIVE
RBC UA: NEGATIVE
Specific Gravity, UA: 1.015 (ref 1.005–1.030)
Urobilinogen, Ur: 0.2 mg/dL (ref 0.2–1.0)
pH, UA: 7.5 (ref 5.0–7.5)

## 2018-08-30 LAB — MICROSCOPIC EXAMINATION
Casts: NONE SEEN /lpf
RBC MICROSCOPIC, UA: NONE SEEN /HPF (ref 0–2)

## 2018-08-30 LAB — IGA: IgA/Immunoglobulin A, Serum: 235 mg/dL (ref 87–352)

## 2018-08-30 NOTE — Progress Notes (Signed)
CC'D TO PCP °

## 2018-08-30 NOTE — Progress Notes (Signed)
Scheduled

## 2018-09-01 LAB — SPECIMEN STATUS REPORT

## 2018-09-01 LAB — TISSUE TRANSGLUTAMINASE, IGA

## 2018-09-26 ENCOUNTER — Other Ambulatory Visit: Payer: Self-pay | Admitting: Family Medicine

## 2018-10-12 ENCOUNTER — Other Ambulatory Visit: Payer: Self-pay | Admitting: *Deleted

## 2018-10-12 ENCOUNTER — Telehealth: Payer: Self-pay | Admitting: *Deleted

## 2018-10-12 MED ORDER — OSELTAMIVIR PHOSPHATE 75 MG PO CAPS: 75.0000 mg | ORAL_CAPSULE | Freq: Two times a day (BID) | ORAL | 0 refills | Status: DC

## 2018-10-12 MED ORDER — ONDANSETRON HCL 8 MG PO TABS: 8.0000 mg | ORAL_TABLET | Freq: Three times a day (TID) | ORAL | 0 refills | Status: DC | PRN

## 2018-10-12 NOTE — Telephone Encounter (Signed)
Robin HongJudy calling for pt. States her boss had flu and she was exposed. Today having headache, stomach ache, nausea, diarrhea and chills and sweating. Offered appt today and states she is running to bathroom to bad to be able to come in.   [facility] [place]

## 2018-10-12 NOTE — Telephone Encounter (Signed)
Nurses please communicate with family regarding this May use Zofran 8 mg 1 3 times daily as needed nausea, #15  May use over-the-counter Imodium 1 tablet 4 times daily as needed diarrhea May call in Tamiflu 75 mg twice daily for 5 days-this could be sent to be on file just in case respiratory symptoms occur later today Currently right now I think the likelihood of the flu is low but if developed sore throat runny nose or cough within the next 24 hours this medicine would be beneficial  1 option for the patient to do would be to allow [place] to send this medication in and they would start it if any respiratory symptoms started up  Certainly if progressive symptoms, or does not turn the corner over the next 24 hours or getting worse it would be wise to be seen here or ER

## 2018-10-12 NOTE — Telephone Encounter (Signed)
Discussed with pt. Pt verbalized understanding. meds sent to pharm. tamiflu put on file at pharm.

## 2018-10-16 ENCOUNTER — Encounter: Payer: Self-pay | Admitting: Gastroenterology

## 2018-11-05 ENCOUNTER — Telehealth: Payer: Self-pay | Admitting: Family Medicine

## 2018-11-05 NOTE — Telephone Encounter (Signed)
Patient has six month follow up on 2/28 and needing her labs done.

## 2018-11-05 NOTE — Telephone Encounter (Signed)
Last labs by dr scott was 11/06/17 lipid, bmp, free t4, tsh, hiv antibody, hep c antibody.

## 2018-11-06 ENCOUNTER — Other Ambulatory Visit: Payer: Self-pay | Admitting: Family Medicine

## 2018-11-07 ENCOUNTER — Other Ambulatory Visit: Payer: Self-pay | Admitting: Family Medicine

## 2018-11-07 ENCOUNTER — Ambulatory Visit: Payer: BC Managed Care – PPO | Admitting: Family Medicine

## 2018-11-07 DIAGNOSIS — E7849 Other hyperlipidemia: Secondary | ICD-10-CM

## 2018-11-07 DIAGNOSIS — Z79899 Other long term (current) drug therapy: Secondary | ICD-10-CM

## 2018-11-07 DIAGNOSIS — I10 Essential (primary) hypertension: Secondary | ICD-10-CM

## 2018-11-07 NOTE — Telephone Encounter (Signed)
Lipid, liver, met 7 

## 2018-11-07 NOTE — Telephone Encounter (Signed)
Lab orders placed and pt is aware 

## 2018-11-13 LAB — LIPID PANEL
Chol/HDL Ratio: 3.7 ratio (ref 0.0–4.4)
Cholesterol, Total: 227 mg/dL — ABNORMAL HIGH (ref 100–199)
HDL: 62 mg/dL (ref >39)
LDL Calculated: 133 mg/dL — ABNORMAL HIGH (ref 0–99)
Triglycerides: 159 mg/dL — ABNORMAL HIGH (ref 0–149)
VLDL Cholesterol Cal: 32 mg/dL (ref 5–40)

## 2018-11-13 LAB — HEPATIC FUNCTION PANEL
ALT: 21 IU/L (ref 0–32)
AST: 23 IU/L (ref 0–40)
Albumin: 4.6 g/dL (ref 3.8–4.9)
Alkaline Phosphatase: 80 IU/L (ref 39–117)
Bilirubin Total: 0.4 mg/dL (ref 0.0–1.2)
Bilirubin, Direct: 0.11 mg/dL (ref 0.00–0.40)
Total Protein: 7 g/dL (ref 6.0–8.5)

## 2018-11-13 LAB — BASIC METABOLIC PANEL
BUN/Creatinine Ratio: 22 (ref 9–23)
BUN: 20 mg/dL (ref 6–24)
CO2: 25 mmol/L (ref 20–29)
Calcium: 9.7 mg/dL (ref 8.7–10.2)
Chloride: 102 mmol/L (ref 96–106)
Creatinine, Ser: 0.92 mg/dL (ref 0.57–1.00)
GFR calc Af Amer: 82 mL/min/{1.73_m2} (ref >59)
GFR calc non Af Amer: 71 mL/min/{1.73_m2} (ref >59)
Glucose: 94 mg/dL (ref 65–99)
Potassium: 4.4 mmol/L (ref 3.5–5.2)
Sodium: 141 mmol/L (ref 134–144)

## 2018-11-16 ENCOUNTER — Ambulatory Visit: Payer: BC Managed Care – PPO | Admitting: Family Medicine

## 2018-11-16 ENCOUNTER — Encounter: Payer: Self-pay | Admitting: Family Medicine

## 2018-11-16 VITALS — BP 124/80 | Temp 98.1°F | Ht 66.0 in | Wt 177.6 lb

## 2018-11-16 DIAGNOSIS — E7849 Other hyperlipidemia: Secondary | ICD-10-CM

## 2018-11-16 DIAGNOSIS — F411 Generalized anxiety disorder: Secondary | ICD-10-CM | POA: Diagnosis not present

## 2018-11-16 DIAGNOSIS — F419 Anxiety disorder, unspecified: Secondary | ICD-10-CM

## 2018-11-16 DIAGNOSIS — I10 Essential (primary) hypertension: Secondary | ICD-10-CM | POA: Diagnosis not present

## 2018-11-16 MED ORDER — AMLODIPINE BESYLATE 5 MG PO TABS: 5.0000 mg | ORAL_TABLET | Freq: Every day | ORAL | 1 refills | Status: DC

## 2018-11-16 MED ORDER — ALPRAZOLAM 1 MG PO TABS: 1.0000 mg | ORAL_TABLET | Freq: Two times a day (BID) | ORAL | 5 refills | Status: DC | PRN

## 2018-11-16 MED ORDER — SERTRALINE HCL 100 MG PO TABS: 100.0000 mg | ORAL_TABLET | Freq: Every day | ORAL | 5 refills | Status: DC

## 2018-11-16 NOTE — Progress Notes (Signed)
   Subjective:    Patient ID: Robin Dixon, female    DOB: [birth year], 53 y.o.   MRN: [id number]  Hypertension  This is a chronic problem. Pertinent negatives include no chest pain, headaches or shortness of breath. Treatments tried: amlodipine. There are no compliance problems (takes  med every day, eats healthy, walks every day).    Headache, bloody nasal discharge, sore throat. Started 2 weeks ago. Taking otc cold meds.  Patient relates head congestion drainage coughing denies high fever chills sweats wheezing difficulty breathing  Anxiety and stress. Uncontrollable outburst. States anything could set her off.  Patient finds her self feeling stressed out denies being depressed but finds her self feeling anxious at times finds her self overwhelmed at times takes her nerve medicine on a regular basis and her Lexapro and regular basis.  Review of Systems  Constitutional: Negative for activity change, appetite change, fatigue and fever.  HENT: Positive for congestion and rhinorrhea. Negative for ear pain.   Eyes: Negative for discharge.  Respiratory: Negative for cough, shortness of breath and wheezing.   Cardiovascular: Negative for chest pain and leg swelling.  Gastrointestinal: Negative for abdominal pain and diarrhea.  Endocrine: Negative for polydipsia and polyphagia.  Skin: Negative for color change.  Neurological: Negative for dizziness, weakness and headaches.  Psychiatric/Behavioral: Negative for behavioral problems and confusion.       Objective:   Physical Exam Vitals signs reviewed.  Constitutional:      General: She is not in acute distress. HENT:     Head: Normocephalic.  Cardiovascular:     Rate and Rhythm: Normal rate and regular rhythm.     Heart sounds: Normal heart sounds. No murmur.  Pulmonary:     Effort: Pulmonary effort is normal.     Breath sounds: Normal breath sounds.  Lymphadenopathy:     Cervical: No cervical adenopathy.  Neurological:     Mental  Status: She is alert.  Psychiatric:        Behavior: Behavior normal.           Assessment & Plan:  HTN- Patient was seen today as part of a visit regarding hypertension. The importance of healthy diet and regular physical activity was discussed. The importance of compliance with medications discussed.  Ideal goal is to keep blood pressure low elevated levels certainly below 140/90 when possible.  The patient was counseled that keeping blood pressure under control lessen his risk of complications.  The importance of regular follow-ups was discussed with the patient.  Low-salt diet such as DASH recommended.  Regular physical activity was recommended as well.  Patient was advised to keep regular follow-ups. Patient was encouraged to exercise more and watch diet  Start sertraline to see if that will help with anxiety stop Lexapro May continue the alprazolam not to exceed twice daily follow-up if progressive troubles  Significant sinus related issues recommended some over-the-counter measures to try to help if he gets worse we can send in an antibiotic  Recheck in 6 months

## 2018-11-17 ENCOUNTER — Encounter: Payer: Self-pay | Admitting: Family Medicine

## 2018-11-19 ENCOUNTER — Encounter: Payer: Self-pay | Admitting: Family Medicine

## 2018-11-19 ENCOUNTER — Other Ambulatory Visit: Payer: Self-pay

## 2018-11-19 MED ORDER — SERTRALINE HCL 100 MG PO TABS: ORAL_TABLET | ORAL | 5 refills | Status: DC

## 2018-11-19 NOTE — Telephone Encounter (Signed)
Nurses That was a misprint on the prescription Please send patient a copy of this message via my chart  #1- take 1-1/2 tablet for the first week then take 2 daily of the sertraline  #2-nurses-please send clarification of prescription into her pharmacy quantity 60 tablets will still stay the same for each month with 5 refills but the directions on the bottle will say 2/day  #3-the patient will note to start off with 1 and half tablet for the first week then 2 tablets daily thereafter  #4-finally Stevan Born will send [place] an update in approximately 3 weeks on how this is doing

## 2018-11-19 NOTE — Telephone Encounter (Signed)
Nurses May send in doxycycline 100 mg twice daily for 7 days Also resend the message that was just done earlier regarding how to take her sertraline apparently she did not get the message

## 2018-11-20 ENCOUNTER — Other Ambulatory Visit: Payer: Self-pay | Admitting: *Deleted

## 2018-11-20 MED ORDER — DOXYCYCLINE HYCLATE 100 MG PO TABS: 100.0000 mg | ORAL_TABLET | Freq: Two times a day (BID) | ORAL | 0 refills | Status: DC

## 2018-11-21 ENCOUNTER — Ambulatory Visit: Payer: BC Managed Care – PPO | Admitting: Gastroenterology

## 2018-12-17 ENCOUNTER — Telehealth: Payer: Self-pay | Admitting: Family Medicine

## 2018-12-17 NOTE — Telephone Encounter (Signed)
zoloft 100 mg tablet. Take one tablet by mouth daily

## 2018-12-17 NOTE — Telephone Encounter (Signed)
Telephone call- Mailbox is full- need to clarify dosage chart says 2 tabs a day and fax from pharmacy states one tab a day

## 2018-12-17 NOTE — Telephone Encounter (Signed)
Fax from pharmacy stating that patient is requesting an early refill. Not due until 12/31/2018 per insurance. Last filled 11/16/2018. Pharmacist states they can do an override due to COVID. The nature of the med requires permission from provider. Please advise. Thank you

## 2018-12-17 NOTE — Telephone Encounter (Signed)
It would be fine to go ahead and fill this 1 early so

## 2018-12-25 NOTE — Telephone Encounter (Signed)
Pt contacted and states that she is suppose to be taking 2 tablets daily. Pt states the pharmacy filled the wrong script. Pt states she can get these filled on 12/31/2018. Pt has enough to last until then.

## 2019-01-10 ENCOUNTER — Other Ambulatory Visit: Payer: Self-pay

## 2019-01-10 ENCOUNTER — Encounter: Payer: Self-pay | Admitting: Gastroenterology

## 2019-01-10 ENCOUNTER — Ambulatory Visit (INDEPENDENT_AMBULATORY_CARE_PROVIDER_SITE_OTHER): Payer: BC Managed Care – PPO | Admitting: Gastroenterology

## 2019-01-10 DIAGNOSIS — K219 Gastro-esophageal reflux disease without esophagitis: Secondary | ICD-10-CM | POA: Diagnosis not present

## 2019-01-10 DIAGNOSIS — K5909 Other constipation: Secondary | ICD-10-CM | POA: Diagnosis not present

## 2019-01-10 DIAGNOSIS — R110 Nausea: Secondary | ICD-10-CM | POA: Diagnosis not present

## 2019-01-10 MED ORDER — ONDANSETRON HCL 8 MG PO TABS: 8.0000 mg | ORAL_TABLET | Freq: Three times a day (TID) | ORAL | 1 refills | Status: DC | PRN

## 2019-01-10 NOTE — Assessment & Plan Note (Signed)
SYMPTOMS FAIRLY WELL CONTROLLED WITH OCCASIONAL DIARRHEA.  DISCUSSED WHICH MEDS CONTRIBUTE TO CONSTIPATION: NORVASC, ZOLOFT, BENTYL, ZOFRAN. DRINK WATER TO KEEP YOUR URINE LIGHT YELLOW. FOLLOW A HIGH FIBER DIET. AVOID ITEMS THAT CAUSE BLOATING & GAS.  HANDOUT GIVEN. USE BENTYL WHEN NEEDED TO MANAGE DIARRHEA. USE USE MILK OF MAGNESIA PILLS OR LIQUID THREE TIMES A DAY TO REDUCE CONSTIPATION. FOLLOW UP IN 6 MOS.

## 2019-01-10 NOTE — Patient Instructions (Addendum)
DRINK WATER TO KEEP YOUR URINE LIGHT YELLOW.  FOLLOW A HIGH FIBER DIET. AVOID ITEMS THAT CAUSE BLOATING & GAS. SEE INFO BELOW.   USE ZOFRAN AS NEEDED FOR NAUSEA.  IF THE NAUSEA CONTINUES TO BE A PROBLEMS, DISCUSS WITH DR. Gerda Diss YOUR FREQUENT NAUSEA AND ASK HIM IF YOU CAN REDUCE THE DOSE OF ZOLOFT TO 1.5 TABLETS DAILY.  USE BENTYL WHEN NEEDED TO MANAGE DIARRHEA.  USE USE MILK OF MAGNESIA PILLS OR LIQUID THREE TIMES A DAY TO REDUCE CONSTIPATION.   CONTINUE DEXILANT.   FOLLOW UP IN 6 MOS.   High-Fiber Diet A high-fiber diet changes your normal diet to include more whole grains, legumes, fruits, and vegetables. Changes in the diet involve replacing refined carbohydrates with unrefined foods. The calorie level of the diet is essentially unchanged. The Dietary Reference Intake (recommended amount) for adult males is 38 grams per day. For adult females, it is 25 grams per day. Pregnant and lactating women should consume 28 grams of fiber per day. Fiber is the intact part of a plant that is not broken down during digestion. Functional fiber is fiber that has been isolated from the plant to provide a beneficial effect in the body.  PURPOSE  Increase stool bulk.   Ease and regulate bowel movements.   Lower cholesterol.   REDUCE RISK OF COLON CANCER  INDICATIONS THAT YOU NEED MORE FIBER  Constipation and hemorrhoids.   Uncomplicated diverticulosis (intestine condition) and irritable bowel syndrome.   Weight management.   As a protective measure against hardening of the arteries (atherosclerosis), diabetes, and cancer.   GUIDELINES FOR INCREASING FIBER IN THE DIET  Start adding fiber to the diet slowly. A gradual increase of about 5 more grams (2 slices of whole-wheat bread, 2 servings of most fruits or vegetables, or 1 bowl of high-fiber cereal) per day is best. Too rapid an increase in fiber may result in constipation, flatulence, and bloating.   Drink enough water and fluids to  keep your urine clear or pale yellow. Water, juice, or caffeine-free drinks are recommended. Not drinking enough fluid may cause constipation.   Eat a variety of high-fiber foods rather than one type of fiber.   Try to increase your intake of fiber through using high-fiber foods rather than fiber pills or supplements that contain small amounts of fiber.   The goal is to change the types of food eaten. Do not supplement your present diet with high-fiber foods, but replace foods in your present diet.    INCLUDE A VARIETY OF FIBER SOURCES  Replace refined and processed grains with whole grains, canned fruits with fresh fruits, and incorporate other fiber sources. White rice, white breads, and most bakery goods contain little or no fiber.   Brown whole-grain rice, buckwheat oats, and many fruits and vegetables are all good sources of fiber. These include: broccoli, Brussels sprouts, cabbage, cauliflower, beets, sweet potatoes, white potatoes (skin on), carrots, tomatoes, eggplant, squash, berries, fresh fruits, and dried fruits.   Cereals appear to be the richest source of fiber. Cereal fiber is found in whole grains and bran. Bran is the fiber-rich outer coat of cereal grain, which is largely removed in refining. In whole-grain cereals, the bran remains. In breakfast cereals, the largest amount of fiber is found in those with "bran" in their names. The fiber content is sometimes indicated on the label.   You may need to include additional fruits and vegetables each day.   In baking, for 1 cup white flour,  you may use the following substitutions:   1 cup whole-wheat flour minus 2 tablespoons.   1/2 cup white flour plus 1/2 cup whole-wheat flour.

## 2019-01-10 NOTE — Assessment & Plan Note (Signed)
SYMPTOMS NOT IDEALLY CONTROLLED AND MOST LIKELY DUE TO HIGH DOSE SSRI.  USE ZOFRAN AS NEEDED FOR NAUSEA. IF THE NAUSEA CONTINUES TO BE A PROBLEMS, DISCUSS WITH DR. Gerda Diss YOUR FREQUENT NAUSEA AND ASK HIM IF YOU CAN REDUCE THE DOSE OF ZOLOFT TO 1.5 TABLETS DAILY. FOLLOW UP IN 6 MOS.

## 2019-01-10 NOTE — Progress Notes (Signed)
Subjective:    Patient ID: Robin Dixon, female    DOB: 1966/02/26, 53 y.o.   MRN: [id number]  [name], MD   Primary Care Physician:  [name], MD  Primary GI:  Jonette Eva, MD   Patient Location: home   Provider Location: [facility] office   Reason for Visit:  DIARRHEA/ABDOMINAL PAIN   Persons present on the virtual encounter, with roles: patient, myself (provider), MARTINA BOOTH CMA (update meds/allergies)   Total time (minutes) spent on medical discussion: 20 MINUTES   Due to COVID-19, visit was VIA TELEPHONE VISIT DUE TO COVID 19. VISIT IS CONDUCTED VIRTUALLY AND WAS REQUESTED BY PATIENT.   Virtual Visit via TELEPHONE   I connected with  [facility]  and verified that I am speaking with the correct person using two identifiers.   I discussed the limitations, risks, security and privacy concerns of performing an evaluation and management service by telephone/video and the availability of in person appointments. I also discussed with the patient that there may be a patient responsible charge related to this service. The patient expressed understanding and agreed to proceed.  HPI Diarrhea is better. May have constipation. MID-AFTERNOON TO EVENING MAY HAVE NAUSEA: 1-2X/WEEK. NO NOTICED IT'S RELATED TO EATING OR FOOD. HEARTBURN: CONTROLLED. BMs: LAST FEW DAYS HAD HARD BALL STOOLS. JUST STARTED ON ZOLOFT 200 MG @ 1.5 QD(FOR 7 DAYS AND THEN NOW ON 2 A DAY FOR PAST 2 MOS. WORKING FOR ANXIETY. Prudy Feeler: AT LEAST ONCE A DAY EVERY DAY. THIS AM HAD ABDOMINAL PAIN WITH CONSTIPATION. LAST DIARRHEA: 4 DAYS AGO([place]). CAN BE ASSOCIATED WITH CERTAIN FOODS(EGGS).  PT DENIES FEVER, CHILLS, HEMATOCHEZIA, HEMATEMESIS, vomiting, melena, CHEST PAIN, SHORTNESS OF BREATH, CHANGE IN BOWEL IN HABITS,  problems swallowing, OR heartburn or indigestion.  Past Medical History:  Diagnosis Date  . Anxiety 03/29/2013  . Carpal tunnel syndrome 07/02/2015   Right  . History of neck injury   .  Hot flashes 03/29/2013  . Hypertension   . Mental disorder    anxiety depression  . PMB (postmenopausal bleeding) 06/12/2013   US WNL   Past Surgical History:  Procedure Laterality Date  . CARPAL TUNNEL RELEASE Right 09/23/2015   Procedure: RIGHT CARPAL TUNNEL RELEASE;  Surgeon: Vickki Hearing, MD;  Location: [facility];  Service: Orthopedics;  Laterality: Right;  . CHOLECYSTECTOMY     [place]  . COLONOSCOPY N/A 12/27/2013   Procedure: COLONOSCOPY;  Surgeon: West Bali, MD;  Location: [facility] SU[facility];  Service: Endoscopy;  Laterality: N/A;  1:45  . ESOPHAGOGASTRODUODENOSCOPY N/A 12/27/2013   Procedure: ESOPHAGOGASTRODUODENOSCOPY (EGD);  Surgeon: West Bali, MD;  Location: [facility];  Service: Endoscopy;  Laterality: N/A;   Allergies  Allergen Reactions  . Keflex [Cephalexin] Nausea And Vomiting    vomiting   Current Outpatient Medications  Medication Sig    . ALPRAZolam (XANAX) 1 MG tablet Take 1 tablet (1 mg total) by mouth 2 (two) times daily as needed.    Marland Kitchen amLODipine (NORVASC) 5 MG tablet Take 1 tablet (5 mg total) by mouth daily.    Marland Kitchen dexlansoprazole (DEXILANT) 60 MG capsule Take 1 capsule (60 mg total) by mouth daily.    Marland Kitchen dicyclomine (BENTYL) 10 MG capsule 1-2 PO QAC OR EVERY 4 HOURS TO CONTROL DIARRHEA. NO MORE THAN 8 PILLS A DAY.)    . ondansetron (ZOFRAN) 8 MG tablet Take by mouth every 8 (eight) hours as needed for nausea or vomiting.    Marland Kitchen  sertraline (ZOLOFT) 100 MG tablet Take two tablets by mouth daily    . traZODone (DESYREL) 50 MG tablet Take 1 tablet (50 mg total) by mouth at bedtime as needed for sleep.    .       Review of Systems PER HPI OTHERWISE ALL SYSTEMS ARE NEGATIVE.    Objective:   Physical Exam  TELEPHONE VISIT DUE TO COVID 19, VISIT IS CONDUCTED VIRTUALLY AND WAS REQUESTED BY PATIENT.      Assessment & Plan:

## 2019-01-10 NOTE — Assessment & Plan Note (Signed)
SYMPTOMS CONTROLLED/RESOLVED.  DRINK WATER TO KEEP YOUR URINE LIGHT YELLOW. CONTINUE DEXILANT. FOLLOW UP IN 6 MOS.

## 2019-01-11 NOTE — Progress Notes (Signed)
ON RECALL  °

## 2019-01-14 ENCOUNTER — Encounter: Payer: Self-pay | Admitting: Family Medicine

## 2019-02-23 ENCOUNTER — Encounter: Payer: Self-pay | Admitting: Family Medicine

## 2019-02-25 ENCOUNTER — Encounter: Payer: Self-pay | Admitting: Nurse Practitioner

## 2019-02-25 ENCOUNTER — Ambulatory Visit (INDEPENDENT_AMBULATORY_CARE_PROVIDER_SITE_OTHER): Payer: BC Managed Care – PPO | Admitting: Nurse Practitioner

## 2019-02-25 ENCOUNTER — Other Ambulatory Visit: Payer: Self-pay

## 2019-02-25 VITALS — BP 134/78 | Temp 97.3°F | Wt 171.2 lb

## 2019-02-25 DIAGNOSIS — R300 Dysuria: Secondary | ICD-10-CM

## 2019-02-25 LAB — POCT URINALYSIS DIPSTICK
Spec Grav, UA: <=1.005 — AB (ref 1.010–1.025)
pH, UA: 7 (ref 5.0–8.0)

## 2019-02-25 LAB — POC URINALYSIS WITH MICROSCOPIC (NON AUTO)MANUAL RESULT: Epithelial cells, urine per micros: NEGATIVE

## 2019-02-25 MED ORDER — SULFAMETHOXAZOLE-TRIMETHOPRIM 800-160 MG PO TABS: 1.0000 | ORAL_TABLET | Freq: Two times a day (BID) | ORAL | 0 refills | Status: DC

## 2019-02-25 NOTE — Patient Instructions (Addendum)
Voltaren gel  AZO as directed for bladder spasms for 48 hours then stop [employer], [place] for skin cancer screening

## 2019-02-25 NOTE — Progress Notes (Signed)
   Subjective:    Patient ID: Robin Dixon, female    DOB: 01/30/1966, 53 y.o.   MRN: [id number]  Urinary Tract Infection   This is a new problem. Episode onset: Thursday or Friday. The quality of the pain is described as aching. Associated symptoms include frequency. Associated symptoms comments: Pt has bought a tissue with sample of what she has been passing. Pt states she has had intermittent back and side pain for years. . Treatments tried: Cranberry Pills. The treatment provided mild relief.  C/o urinary frequency and urgency. No dysuria. Describes as a "weird feeling". No fever or chills. No vaginal bleeding or pelvic pain. Has had some mild hematuria in the past with a negative work up with urology. Mild nagging pain in the right lower side into the back area. No history or UTI or kidney stones.    Review of Systems  Genitourinary: Positive for frequency.       Objective:   Physical Exam NAD. Alert, oriented. Lungs clear. Heart RRR. Abdomen soft, non distended, non tender. No obvious masses. No CVA or flank tenderness.  Results for orders placed or performed in visit on 02/25/19  POCT Urinalysis Dipstick  Result Value Ref Range   Color, UA     Clarity, UA     Glucose, UA     Bilirubin, UA     Ketones, UA     Spec Grav, UA <=1.005 (A) 1.010 - 1.025   Blood, UA +    pH, UA 7.0 5.0 - 8.0   Protein, UA     Urobilinogen, UA     Nitrite, UA     Leukocytes, UA Moderate (2+) (A) Negative   Appearance     Odor    POC urinalysis w microscopic (non auto)  Result Value Ref Range   Color, UA     Clarity, UA     Glucose, UA     Bilirubin, UA     Ketones, UA     Spec Grav, UA     Blood, UA     pH, UA     Protein, UA     Urobilinogen, UA     Nitrite, UA     Leukocytes, UA     RBC     WBC Casts, UA     Mucus, UA     Crystals     Bacteria, UA few    Epithelial cells, urine per micros neg    WBC, UA 5+ per HPF    RBC, UA rare       Assessment & Plan:  Dysuria - Plan:  POCT Urinalysis Dipstick, Urine Culture, POC urinalysis w microscopic (non auto)  Meds ordered this encounter  Medications  . sulfamethoxazole-trimethoprim (BACTRIM DS) 800-160 MG tablet    Sig: Take 1 tablet by mouth 2 (two) times daily.    Dispense:  14 tablet    Refill:  0    Order Specific Question:   Supervising Provider    Answer:   Sallee Lange A [[id number]]   AZO as directed for bladder spasms for 48 hours then stop Increase clear fluid intake. Culture pending. Warning signs reviewed. Call back in 48-72 hours if no improvement, sooner if worse.

## 2019-02-28 LAB — URINE CULTURE

## 2019-02-28 LAB — SPECIMEN STATUS REPORT

## 2019-03-07 ENCOUNTER — Other Ambulatory Visit (HOSPITAL_COMMUNITY): Payer: Self-pay | Admitting: Adult Health

## 2019-03-07 DIAGNOSIS — Z1231 Encounter for screening mammogram for malignant neoplasm of breast: Secondary | ICD-10-CM

## 2019-03-11 ENCOUNTER — Ambulatory Visit (HOSPITAL_COMMUNITY)
Admission: RE | Admit: 2019-03-11 | Discharge: 2019-03-11 | Disposition: A | Discharge status: Home / Self Care | Payer: BC Managed Care – PPO | Source: Ambulatory Visit | Attending: Adult Health | Admitting: Adult Health

## 2019-03-11 ENCOUNTER — Other Ambulatory Visit: Payer: Self-pay

## 2019-03-11 DIAGNOSIS — Z1231 Encounter for screening mammogram for malignant neoplasm of breast: Secondary | ICD-10-CM | POA: Diagnosis present

## 2019-05-13 ENCOUNTER — Ambulatory Visit: Payer: BC Managed Care – PPO | Admitting: Family Medicine

## 2019-05-15 ENCOUNTER — Ambulatory Visit (INDEPENDENT_AMBULATORY_CARE_PROVIDER_SITE_OTHER): Payer: BC Managed Care – PPO | Admitting: Family Medicine

## 2019-05-15 ENCOUNTER — Other Ambulatory Visit: Payer: Self-pay

## 2019-05-15 DIAGNOSIS — I10 Essential (primary) hypertension: Secondary | ICD-10-CM

## 2019-05-15 DIAGNOSIS — R1013 Epigastric pain: Secondary | ICD-10-CM

## 2019-05-15 DIAGNOSIS — F411 Generalized anxiety disorder: Secondary | ICD-10-CM | POA: Diagnosis not present

## 2019-05-15 MED ORDER — DEXILANT 60 MG PO CPDR: 60.0000 mg | DELAYED_RELEASE_CAPSULE | Freq: Every day | ORAL | 1 refills | Status: DC

## 2019-05-15 MED ORDER — SERTRALINE HCL 100 MG PO TABS: ORAL_TABLET | ORAL | 1 refills | Status: DC

## 2019-05-15 MED ORDER — AMLODIPINE BESYLATE 5 MG PO TABS: 5.0000 mg | ORAL_TABLET | Freq: Every day | ORAL | 1 refills | Status: DC

## 2019-05-15 MED ORDER — ALPRAZOLAM 1 MG PO TABS: 1.0000 mg | ORAL_TABLET | Freq: Two times a day (BID) | ORAL | 5 refills | Status: DC | PRN

## 2019-05-15 MED ORDER — TRAZODONE HCL 50 MG PO TABS: 50.0000 mg | ORAL_TABLET | Freq: Every evening | ORAL | 1 refills | Status: DC | PRN

## 2019-05-15 NOTE — Progress Notes (Signed)
  Subjective:     Patient ID: Robin Dixon, female   DOB: [birth year], 53 y.o.   MRN: [id number]  Hypertension This is a new problem. Pertinent negatives include no chest pain or shortness of breath. Treatments tried: amlodipine 5mg . There are no compliance problems.   Patient for blood pressure check up.  The patient does have hypertension.  The patient is on medication.  Patient relates compliance with meds. Todays BP reviewed with the patient. Patient denies issues with medication. Patient relates reasonable diet. Patient tries to minimize salt. Patient aware of BP goals.  Patient with generalized anxiety overall doing fairly well with medication.  Try to do the best she can staying calm with all the stress of everything going on denies being depressed Pt states no concerns today.   Virtual Visit via Telephone Note  I connected with Robin Dixon on 05/15/19 at  9:00 AM EDT by telephone and verified that I am speaking with the correct person using two identifiers.  Location: Patient: home Provider: office   I discussed the limitations, risks, security and privacy concerns of performing an evaluation and management service by telephone and the availability of in person appointments. I also discussed with the patient that there may be a patient responsible charge related to this service. The patient expressed understanding and agreed to proceed.   History of Present Illness:    Observations/Objective:   Assessment and Plan:   Follow Up Instructions:    I discussed the assessment and treatment plan with the patient. The patient was provided an opportunity to ask questions and all were answered. The patient agreed with the plan and demonstrated an understanding of the instructions.   The patient was advised to call back or seek an in-person evaluation if the symptoms worsen or if the condition fails to improve as anticipated.  I provided 17 minutes of non-face-to-face time  during this encounter.      Review of Systems  Constitutional: Negative for activity change, appetite change and fatigue.  HENT: Negative for congestion and rhinorrhea.   Respiratory: Negative for cough and shortness of breath.   Cardiovascular: Negative for chest pain and leg swelling.  Gastrointestinal: Negative for abdominal pain and diarrhea.  Endocrine: Negative for polydipsia and polyphagia.  Skin: Negative for color change.  Neurological: Negative for dizziness and weakness.  Psychiatric/Behavioral: Negative for behavioral problems and confusion.       Objective:   Physical Exam Today's visit was via telephone Physical exam was not possible for this visit     Assessment:     Hypertension GAD dyspepsia    Plan:     Continue medication Refills given Patient denies abusing medicine Drug registry checked Doing stress relief techniques Being careful with salt in diet and staying active Follow-up 6 months lab work on next follow-up

## 2019-06-01 ENCOUNTER — Other Ambulatory Visit: Payer: Self-pay | Admitting: Family Medicine

## 2019-06-19 ENCOUNTER — Encounter: Payer: Self-pay | Admitting: Gastroenterology

## 2019-07-03 ENCOUNTER — Other Ambulatory Visit: Payer: Self-pay

## 2019-07-03 DIAGNOSIS — Z20822 Contact with and (suspected) exposure to covid-19: Secondary | ICD-10-CM

## 2019-07-05 LAB — NOVEL CORONAVIRUS, NAA: SARS-CoV-2, NAA: NOT DETECTED

## 2019-07-09 ENCOUNTER — Other Ambulatory Visit: Payer: Self-pay

## 2019-07-09 ENCOUNTER — Ambulatory Visit: Payer: BC Managed Care – PPO

## 2019-07-09 ENCOUNTER — Ambulatory Visit: Payer: BC Managed Care – PPO | Admitting: Orthopaedic Surgery

## 2019-07-09 ENCOUNTER — Other Ambulatory Visit: Payer: Self-pay | Admitting: Orthopaedic Surgery

## 2019-07-09 ENCOUNTER — Encounter: Payer: Self-pay | Admitting: Orthopaedic Surgery

## 2019-07-09 VITALS — BP 138/80 | HR 69 | Ht 66.0 in | Wt 167.0 lb

## 2019-07-09 DIAGNOSIS — M79671 Pain in right foot: Secondary | ICD-10-CM

## 2019-07-09 DIAGNOSIS — S92351A Displaced fracture of fifth metatarsal bone, right foot, initial encounter for closed fracture: Secondary | ICD-10-CM | POA: Diagnosis not present

## 2019-07-09 DIAGNOSIS — M25571 Pain in right ankle and joints of right foot: Secondary | ICD-10-CM

## 2019-07-09 DIAGNOSIS — M25572 Pain in left ankle and joints of left foot: Secondary | ICD-10-CM

## 2019-07-09 NOTE — Progress Notes (Signed)
Subjective:    Patient ID: Robin Dixon, female    DOB: 1966/02/11, 53 y.o.   MRN: [id number]  HPI She fell off chair while cleaning a high object on Friday, 07-05-2019, and hurt her right lateral foot. She has swelling, bruising and pain.  She has used ice, rubs, elevation.  It still hurts.  There is no other injury.   Review of Systems  Constitutional: Positive for activity change.  Musculoskeletal: Positive for arthralgias, gait problem and joint swelling.  All other systems reviewed and are negative.  For Review of Systems, all other systems reviewed and are negative.  The following is a summary of the past history medically, past history surgically, known current medicines, social history and family history.  This information is gathered electronically by the computer from prior information and documentation.  I review this each visit and have found including this information at this point in the chart is beneficial and informative.   Past Medical History:  Diagnosis Date  . Anxiety 03/29/2013  . Carpal tunnel syndrome 07/02/2015   Right  . History of neck injury   . Hot flashes 03/29/2013  . Hypertension   . Mental disorder    anxiety depression  . PMB (postmenopausal bleeding) 06/12/2013   US WNL    Past Surgical History:  Procedure Laterality Date  . CARPAL TUNNEL RELEASE Right 09/23/2015   Procedure: RIGHT CARPAL TUNNEL RELEASE;  Surgeon: Vickki Hearing, MD;  Location: [facility];  Service: Orthopedics;  Laterality: Right;  . CHOLECYSTECTOMY     [place]  . COLONOSCOPY N/A 12/27/2013   Procedure: COLONOSCOPY;  Surgeon: West Bali, MD;  Location: [facility];  Service: Endoscopy;  Laterality: N/A;  1:45  . ESOPHAGOGASTRODUODENOSCOPY N/A 12/27/2013   Procedure: ESOPHAGOGASTRODUODENOSCOPY (EGD);  Surgeon: West Bali, MD;  Location: [facility];  Service: Endoscopy;  Laterality: N/A;    Current Outpatient Medications on File Prior to Visit   Medication Sig Dispense Refill  . ALPRAZolam (XANAX) 1 MG tablet Take 1 tablet by mouth twice daily as needed 60 tablet 4  . amLODipine (NORVASC) 5 MG tablet Take 1 tablet (5 mg total) by mouth daily. 90 tablet 1  . dexlansoprazole (DEXILANT) 60 MG capsule Take 1 capsule (60 mg total) by mouth daily. 90 capsule 1  . dicyclomine (BENTYL) 10 MG capsule 1-2 PO 30 MINS PRIOR TO MEALS OR EVERY 4 HOURS TO CONTROL DIARRHEA. NO MORE THAN 8 PILLS A DAY. (Patient taking differently: as needed. 1-2 PO 30 MINS PRIOR TO MEALS OR EVERY 4 HOURS TO CONTROL DIARRHEA. NO MORE THAN 8 PILLS A DAY.) 45 capsule 5  . ondansetron (ZOFRAN) 8 MG tablet Take 1 tablet (8 mg total) by mouth every 8 (eight) hours as needed for nausea or vomiting. 40 tablet 1  . sertraline (ZOLOFT) 100 MG tablet Take two tablets by mouth daily 180 tablet 1  . traZODone (DESYREL) 50 MG tablet Take 1 tablet (50 mg total) by mouth at bedtime as needed for sleep. 90 tablet 1   No current facility-administered medications on file prior to visit.     Social History   Socioeconomic History  . Marital status: Single    Spouse name: Not on file  . Number of children: Not on file  . Years of education: Not on file  . Highest education level: Not on file  Occupational History  . Not on file  Social Needs  . Financial resource strain: Not on file  .  Food insecurity    Worry: Not on file    Inability: Not on file  . Transportation needs    Medical: Not on file    Non-medical: Not on file  Tobacco Use  . Smoking status: Former Smoker    Years: 15.00    Types: Cigarettes    Quit date: 02/22/1998    Years since quitting: 21.3  . Smokeless tobacco: Never Used  . Tobacco comment: quit years ago  Substance and Sexual Activity  . Alcohol use: Yes    Comment: occ  . Drug use: No  . Sexual activity: Yes    Birth control/protection: Post-menopausal  Lifestyle  . Physical activity    Days per week: Not on file    Minutes per session: Not on  file  . Stress: Not on file  Relationships  . Social Musicianconnections    Talks on phone: Not on file    Gets together: Not on file    Attends religious service: Not on file    Active member of club or organization: Not on file    Attends meetings of clubs or organizations: Not on file    Relationship status: Not on file  . Intimate partner violence    Fear of current or ex partner: Not on file    Emotionally abused: Not on file    Physically abused: Not on file    Forced sexual activity: Not on file  Other Topics Concern  . Not on file  Social History Narrative   HOBBIES: BankerCOACH BASKETBALL, SOCCER, DANCE AND PLAYS, WORKS PUZZLES. CARES FOR CHICKENS, GUINEAS, A GOATS, A PIGS, A RABBITS.   MARRIED SINCE 2015. OCCASIONAL ETOH.      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    Family History  Problem Relation Age of Onset  . Diabetes Mother   . Hypertension Mother   . Stroke Mother   . Cancer Mother        blood  . Kidney Stones Mother   . COPD Sister   . Hypertension Sister   . Cancer Brother        melanoma  . Hypertension Sister   . Colon cancer Neg Hx   . Colon polyps Neg Hx     BP 138/80   Pulse 69   Ht 5\' 6"  (1.676 m)   Wt 167 lb (75.8 kg)   BMI 26.95 kg/m   Body mass index is 26.95 kg/m.     Objective:   Physical Exam Vitals signs reviewed.  Constitutional:      Appearance: She is well-developed.  HENT:     Head: Normocephalic and atraumatic.  Eyes:     Conjunctiva/sclera: Conjunctivae normal.     Pupils: Pupils are equal, round, and reactive to light.  Neck:     Musculoskeletal: Normal range of motion and neck supple.  Cardiovascular:     Rate and Rhythm: Normal rate and regular rhythm.  Pulmonary:     Effort: Pulmonary effort is normal.  Abdominal:     Palpations: Abdomen is soft.  Musculoskeletal:       Feet:  Skin:    General: Skin is warm and dry.  Neurological:     Mental  Status: She is alert and oriented to person, place, and time.     Cranial Nerves: No cranial nerve deficit.     Motor: No abnormal muscle tone.     Coordination: Coordination normal.  Deep Tendon Reflexes: Reflexes are normal and symmetric. Reflexes normal.  Psychiatric:        Behavior: Behavior normal.        Thought Content: Thought content normal.        Judgment: Judgment normal.    X-rays were done of the right foot, reported separately.       Assessment & Plan:   Encounter Diagnoses  Name Primary?  . Acute foot pain, right Yes  . Fracture of base of fifth metatarsal bone, right, closed, initial encounter    I have explained the x-rays findings.  I have given a CAM walker and instructions for use and also for Contrast Baths.  Take Advil for pain.  Return in two weeks.  X-rays of the right foot then.  Call if any problem.  Precautions discussed.   Electronically Signed Sanjuana Kava, MD 10/20/20209:28 AM

## 2019-07-23 ENCOUNTER — Encounter: Payer: Self-pay | Admitting: Orthopaedic Surgery

## 2019-07-23 ENCOUNTER — Other Ambulatory Visit: Payer: Self-pay

## 2019-07-23 ENCOUNTER — Ambulatory Visit: Payer: BC Managed Care – PPO

## 2019-07-23 ENCOUNTER — Ambulatory Visit (INDEPENDENT_AMBULATORY_CARE_PROVIDER_SITE_OTHER): Payer: BC Managed Care – PPO | Admitting: Orthopaedic Surgery

## 2019-07-23 DIAGNOSIS — S92351D Displaced fracture of fifth metatarsal bone, right foot, subsequent encounter for fracture with routine healing: Secondary | ICD-10-CM

## 2019-07-23 NOTE — Progress Notes (Signed)
CC:  I am OK  He has less pain of the right foot.  He is using his CAM walker.  NV intact.  X-rays were done of the right foot, reported separately.  Encounter Diagnosis  Name Primary?  . Closed fracture of base of fifth metatarsal bone of right foot with routine healing Yes   Continue CAM walker.  Return in three weeks.  X-rays of the right foot then.  Call if any problem.  Precautions discussed.   Electronically Signed Sanjuana Kava, MD 11/3/20209:27 AM

## 2019-07-31 ENCOUNTER — Telehealth: Payer: Self-pay

## 2019-07-31 ENCOUNTER — Other Ambulatory Visit: Payer: Self-pay | Admitting: Gastroenterology

## 2019-07-31 DIAGNOSIS — R110 Nausea: Secondary | ICD-10-CM

## 2019-07-31 MED ORDER — ONDANSETRON HCL 8 MG PO TABS: 8.0000 mg | ORAL_TABLET | Freq: Three times a day (TID) | ORAL | 1 refills | Status: DC | PRN

## 2019-07-31 NOTE — Telephone Encounter (Signed)
Rx sent 

## 2019-07-31 NOTE — Telephone Encounter (Signed)
Refill request received from [facility] for Ondansetron 8 mg tab #40, take 1 po q 8 hrs for nausea and vomiting.

## 2019-08-01 NOTE — Telephone Encounter (Signed)
Noted  

## 2019-08-13 ENCOUNTER — Encounter: Payer: Self-pay | Admitting: Orthopaedic Surgery

## 2019-08-13 ENCOUNTER — Other Ambulatory Visit: Payer: Self-pay

## 2019-08-13 ENCOUNTER — Ambulatory Visit (INDEPENDENT_AMBULATORY_CARE_PROVIDER_SITE_OTHER): Payer: BC Managed Care – PPO | Admitting: Orthopaedic Surgery

## 2019-08-13 ENCOUNTER — Ambulatory Visit (INDEPENDENT_AMBULATORY_CARE_PROVIDER_SITE_OTHER): Payer: BC Managed Care – PPO

## 2019-08-13 DIAGNOSIS — S92351D Displaced fracture of fifth metatarsal bone, right foot, subsequent encounter for fracture with routine healing: Secondary | ICD-10-CM

## 2019-08-13 NOTE — Progress Notes (Signed)
CC:  My foot is better  She has little pain of the right foot.  She is using the CAM walker.  NV intact.  X-rays were done of the right foot, reported separately.  Encounter Diagnosis  Name Primary?  . Closed fracture of base of fifth metatarsal bone of right foot with routine healing Yes   She can gradually come out of the CAM walker.  Return in one month.  X-rays then.  Call if any problem.  Precautions discussed.   Electronically Signed Sanjuana Kava, MD 11/24/20209:24 AM

## 2019-08-22 ENCOUNTER — Other Ambulatory Visit: Payer: Self-pay

## 2019-08-22 ENCOUNTER — Encounter: Payer: Self-pay | Admitting: Gastroenterology

## 2019-08-22 ENCOUNTER — Ambulatory Visit: Payer: BC Managed Care – PPO | Admitting: Gastroenterology

## 2019-08-22 DIAGNOSIS — K219 Gastro-esophageal reflux disease without esophagitis: Secondary | ICD-10-CM

## 2019-08-22 DIAGNOSIS — K5909 Other constipation: Secondary | ICD-10-CM | POA: Diagnosis not present

## 2019-08-22 NOTE — Progress Notes (Signed)
On recall  °

## 2019-08-22 NOTE — Patient Instructions (Signed)
DRINK WATER TO KEEP YOUR URINE LIGHT YELLOW.  FOLLOW A HIGH FIBER DIET. AVOID ITEMS THAT CAUSE BLOATING & GAS.   CONTINUE DEXILANT.  WHEN NEEDED, TAKE DICYCLOMINE 10 MG TABLETS ONE OR TWO 30 MINUTES PRIOR MEALS UP TO THREE TIMES A DAY. IT MAY CAUSE DROWSINESS, DRY EYES/MOUTH, BLURRY VISION, OR DIFFICULTY URINATING.  FOLLOW UP IN 1 YEAR WITH ERIC GILL.

## 2019-08-22 NOTE — Progress Notes (Signed)
Subjective:    Patient ID: Robin Dixon, female    DOB: 05/11/1966, 53 y.o.   MRN: [id number] Robin Sciara, MD   HPI Broke foot in OCT 2020. BMs: LITTLE BALLS LIKE CONSTIPATION AND AFTER THAT IT'LL BE NORMAL. DIARRHEA: RARE. NAUSEA: 1-2X/WEEK. ONLY HAS HEARTBURN IF IT'S SPICY FOODS. USING BENTYL PRN TO PREVENT LOOSE STOOLS. NONE TAKEN RECENTLY.  PT DENIES FEVER, CHILLS, HEMATOCHEZIA, HEMATEMESIS,  vomiting, melena, CHEST PAIN, SHORTNESS OF BREATH, CHANGE IN BOWEL IN HABITS, abdominal pain, problems swallowing, OR heartburn or indigestion.  Past Medical History:  Diagnosis Date  . Anxiety 03/29/2013  . Carpal tunnel syndrome 07/02/2015   Right  . History of neck injury   . Hot flashes 03/29/2013  . Hypertension   . Mental disorder    anxiety depression  . PMB (postmenopausal bleeding) 06/12/2013   US WNL   Past Surgical History:  Procedure Laterality Date  . CARPAL TUNNEL RELEASE Right 09/23/2015   Procedure: RIGHT CARPAL TUNNEL RELEASE;  Surgeon: Vickki Hearing, MD;  Location: [facility];  Service: Orthopedics;  Laterality: Right;  . CHOLECYSTECTOMY     [place]  . COLONOSCOPY N/A 12/27/2013   Procedure: COLONOSCOPY;  Surgeon: West Bali, MD;  Location: [facility];  Service: Endoscopy;  Laterality: N/A;  1:45  . ESOPHAGOGASTRODUODENOSCOPY N/A 12/27/2013   Procedure: ESOPHAGOGASTRODUODENOSCOPY (EGD);  Surgeon: West Bali, MD;  Location: [facility];  Service: Endoscopy;  Laterality: N/A;   Allergies  Allergen Reactions  . Keflex [Cephalexin] Nausea And Vomiting    vomiting    Current Outpatient Medications  Medication Sig    . ALPRAZolam (XANAX) 1 MG tablet Take 1 tablet by mouth twice daily as needed    . amLODipine (NORVASC) 5 MG tablet Take 1 tablet (5 mg total) by mouth daily.    Marland Kitchen dexlansoprazole (DEXILANT) 60 MG capsule Take 1 capsule (60 mg total) by mouth daily.    Marland Kitchen dicyclomine (BENTYL) 10 MG capsule 1-2 PO 30 MINS PRIOR TO MEALS OR EVERY 4  HOURS TO CONTROL DIARRHEA. NO MORE THAN 8 PILLS A DAY.    Marland Kitchen ondansetron (ZOFRAN) 8 MG tablet Take 1 tablet (8 mg total) by mouth every 8 (eight) hours as needed for nausea or vomiting.    . sertraline (ZOLOFT) 100 MG tablet Take two tablets by mouth daily    . traZODone (DESYREL) 50 MG tablet Take 1 tablet (50 mg total) by mouth at bedtime as needed for sleep.     Review of Systems PER HPI OTHERWISE ALL SYSTEMS ARE NEGATIVE.     Objective:   Physical Exam Constitutional:      General: She is not in acute distress.    Appearance: Normal appearance.  HENT:     Mouth/Throat:     Comments: MASK IN PLACE Eyes:     General: No scleral icterus.    Pupils: Pupils are equal, round, and reactive to light.  Neck:     Musculoskeletal: Normal range of motion.  Cardiovascular:     Rate and Rhythm: Normal rate and regular rhythm.     Pulses: Normal pulses.     Heart sounds: Normal heart sounds.  Pulmonary:     Effort: Pulmonary effort is normal.     Breath sounds: Normal breath sounds.  Abdominal:     General: Bowel sounds are normal.     Palpations: Abdomen is soft.     Tenderness: There is no abdominal tenderness.  Musculoskeletal:  Right lower leg: No edema.     Left lower leg: No edema.  Lymphadenopathy:     Cervical: No cervical adenopathy.  Skin:    General: Skin is warm and dry.  Neurological:     Mental Status: She is alert and oriented to person, place, and time.     Comments: NO  NEW FOCAL DEFICITS  Psychiatric:        Mood and Affect: Mood normal.     Comments: NORMAL AFFECT       Assessment & Plan:

## 2019-08-22 NOTE — Assessment & Plan Note (Signed)
SYMPTOMS CONTROLLED/RESOLVED.  DRINK WATER TO KEEP YOUR URINE LIGHT YELLOW. CONTINUE DEXILANT. FOLLOW UP IN 1 YEAR WITH ERIC GILL.

## 2019-08-22 NOTE — Assessment & Plan Note (Signed)
SYMPTOMS FAIRLY WELL CONTROLLED.  DRINK WATER TO KEEP YOUR URINE LIGHT YELLOW. FOLLOW A HIGH FIBER DIET. AVOID ITEMS THAT CAUSE BLOATING & GAS. WHEN NEEDED, TAKE DICYCLOMINE 10 MG TABLETS ONE OR TWO 30 MINUTES PRIOR MEALS UP TO THREE TIMES A DAY. IT MAY CAUSE DROWSINESS, DRY EYES/MOUTH, BLURRY VISION, OR DIFFICULTY URINATING. FOLLOW UP IN 1 YEAR WITH ERIC GILL.

## 2019-08-22 NOTE — Progress Notes (Signed)
cc'ed to pcp °

## 2019-09-10 ENCOUNTER — Ambulatory Visit: Payer: BC Managed Care – PPO

## 2019-09-10 ENCOUNTER — Other Ambulatory Visit: Payer: Self-pay

## 2019-09-10 ENCOUNTER — Encounter: Payer: Self-pay | Admitting: Orthopaedic Surgery

## 2019-09-10 ENCOUNTER — Ambulatory Visit (INDEPENDENT_AMBULATORY_CARE_PROVIDER_SITE_OTHER): Payer: BC Managed Care – PPO | Admitting: Orthopaedic Surgery

## 2019-09-10 VITALS — Ht 66.0 in | Wt 165.0 lb

## 2019-09-10 DIAGNOSIS — S92351D Displaced fracture of fifth metatarsal bone, right foot, subsequent encounter for fracture with routine healing: Secondary | ICD-10-CM

## 2019-09-10 NOTE — Progress Notes (Signed)
Ml:  I do not hurt  She has no pain of the right foot. She is wearing regular shoes and has no limp.  NV intact.  X-rays were done of the right foot, reported separately.  Encounter Diagnosis  Name Primary?  . Closed fracture of base of fifth metatarsal bone of right foot with routine healing Yes   Call if any problem.  Discharge.  Precautions discussed.   Electronically Signed Sanjuana Kava, MD 12/22/20208:54 AM

## 2019-10-30 ENCOUNTER — Other Ambulatory Visit: Payer: Self-pay | Admitting: Family Medicine

## 2019-11-03 ENCOUNTER — Ambulatory Visit: Payer: BC Managed Care – PPO | Attending: Internal Medicine

## 2019-11-03 ENCOUNTER — Other Ambulatory Visit: Payer: Self-pay

## 2019-11-03 DIAGNOSIS — Z23 Encounter for immunization: Secondary | ICD-10-CM

## 2019-11-03 NOTE — Progress Notes (Signed)
   Covid-19 Vaccination Clinic  Name:  AVIA MERKLEY    MRN: [id number] DOB: 04-18-1966  11/03/2019  Ms. Leopard was observed post Covid-19 immunization for 15 minutes without incidence. She was provided with Vaccine Information Sheet and instruction to access the V-Safe system.   Ms. Mura was instructed to call 911 with any severe reactions post vaccine: Marland Kitchen Difficulty breathing  . Swelling of your face and throat  . A fast heartbeat  . A bad rash all over your body  . Dizziness and weakness    Immunizations Administered    Name Date Dose VIS Date Route   Moderna COVID-19 Vaccine 11/03/2019  5:08 PM 0.5 mL 08/20/2019 Intramuscular   Manufacturer: Moderna   Lot: [id number]   NDC: 92780-044-71

## 2019-11-05 ENCOUNTER — Other Ambulatory Visit: Payer: Self-pay

## 2019-11-05 DIAGNOSIS — R110 Nausea: Secondary | ICD-10-CM

## 2019-11-05 MED ORDER — ONDANSETRON HCL 8 MG PO TABS: 8.0000 mg | ORAL_TABLET | Freq: Three times a day (TID) | ORAL | 1 refills | Status: DC | PRN

## 2019-11-11 ENCOUNTER — Other Ambulatory Visit: Payer: Self-pay | Admitting: Family Medicine

## 2019-11-12 ENCOUNTER — Telehealth: Payer: Self-pay | Admitting: Family Medicine

## 2019-11-12 NOTE — Telephone Encounter (Signed)
Pt is stating that pharmacy has not received her DEXILANT 60 MG capsule

## 2019-11-12 NOTE — Telephone Encounter (Signed)
PA was submitted yesterday. Still waiting for response. I called [facility] to see if med would go through and it did for 30 day supply not 90. I called pt and told her pharm would get ready.

## 2019-11-28 ENCOUNTER — Other Ambulatory Visit: Payer: Self-pay | Admitting: Family Medicine

## 2019-11-28 NOTE — Telephone Encounter (Signed)
Last med check up 05/14/19 

## 2019-11-28 NOTE — Telephone Encounter (Signed)
Schedule a virtual visit May do 1 refill and pend

## 2019-11-28 NOTE — Telephone Encounter (Signed)
Please schedule and route back  

## 2019-11-28 NOTE — Telephone Encounter (Signed)
Scheduled 4/5

## 2019-11-28 NOTE — Telephone Encounter (Signed)
Med pended and ready to sign 

## 2019-12-01 ENCOUNTER — Ambulatory Visit: Payer: BC Managed Care – PPO | Attending: Internal Medicine

## 2019-12-01 DIAGNOSIS — Z23 Encounter for immunization: Secondary | ICD-10-CM

## 2019-12-01 NOTE — Progress Notes (Signed)
   Covid-19 Vaccination Clinic  Name:  CASHA ESTUPINAN    MRN: [id number] DOB: 1965/10/06  12/01/2019  Ms. Caridi was observed post Covid-19 immunization for 15 minutes without incident. She was provided with Vaccine Information Sheet and instruction to access the V-Safe system.   Ms. Junod was instructed to call 911 with any severe reactions post vaccine: Marland Kitchen Difficulty breathing  . Swelling of face and throat  . A fast heartbeat  . A bad rash all over body  . Dizziness and weakness   Immunizations Administered    Name Date Dose VIS Date Route   Moderna COVID-19 Vaccine 12/01/2019  1:18 PM 0.5 mL 08/20/2019 Intramuscular   Manufacturer: Moderna   Lot: [id number]   NDC: 32549-826-41

## 2019-12-06 ENCOUNTER — Other Ambulatory Visit: Payer: Self-pay | Admitting: Gastroenterology

## 2019-12-10 DIAGNOSIS — R110 Nausea: Secondary | ICD-10-CM

## 2019-12-10 MED ORDER — ONDANSETRON HCL 8 MG PO TABS: 8.0000 mg | ORAL_TABLET | Freq: Three times a day (TID) | ORAL | 5 refills | Status: DC | PRN

## 2019-12-23 ENCOUNTER — Ambulatory Visit (INDEPENDENT_AMBULATORY_CARE_PROVIDER_SITE_OTHER): Payer: BC Managed Care – PPO | Admitting: Family Medicine

## 2019-12-23 ENCOUNTER — Encounter: Payer: Self-pay | Admitting: Family Medicine

## 2019-12-23 ENCOUNTER — Other Ambulatory Visit: Payer: Self-pay

## 2019-12-23 DIAGNOSIS — I10 Essential (primary) hypertension: Secondary | ICD-10-CM | POA: Diagnosis not present

## 2019-12-23 DIAGNOSIS — E785 Hyperlipidemia, unspecified: Secondary | ICD-10-CM | POA: Insufficient documentation

## 2019-12-23 DIAGNOSIS — F411 Generalized anxiety disorder: Secondary | ICD-10-CM | POA: Diagnosis not present

## 2019-12-23 DIAGNOSIS — G4700 Insomnia, unspecified: Secondary | ICD-10-CM | POA: Diagnosis not present

## 2019-12-23 DIAGNOSIS — Z79899 Other long term (current) drug therapy: Secondary | ICD-10-CM

## 2019-12-23 DIAGNOSIS — E559 Vitamin D deficiency, unspecified: Secondary | ICD-10-CM | POA: Diagnosis not present

## 2019-12-23 HISTORY — DX: Hyperlipidemia, unspecified: E78.5

## 2019-12-23 MED ORDER — DEXILANT 60 MG PO CPDR: 1.0000 | DELAYED_RELEASE_CAPSULE | Freq: Every day | ORAL | 1 refills | Status: DC

## 2019-12-23 MED ORDER — ALPRAZOLAM 1 MG PO TABS: ORAL_TABLET | ORAL | 5 refills | Status: DC

## 2019-12-23 MED ORDER — TRAZODONE HCL 50 MG PO TABS: 50.0000 mg | ORAL_TABLET | Freq: Every evening | ORAL | 1 refills | Status: DC | PRN

## 2019-12-23 MED ORDER — AMLODIPINE BESYLATE 5 MG PO TABS: 5.0000 mg | ORAL_TABLET | Freq: Every day | ORAL | 1 refills | Status: DC

## 2019-12-23 MED ORDER — SERTRALINE HCL 100 MG PO TABS: ORAL_TABLET | ORAL | 1 refills | Status: DC

## 2019-12-23 NOTE — Progress Notes (Signed)
Subjective:    Patient ID: Robin Dixon, female    DOB: 29-Sep-1965, 54 y.o.   MRN: [id number] Virtual visit patient did not have video capability Hypertension This is a chronic problem. Pertinent negatives include no chest pain or shortness of breath. There are no compliance problems (Amlodipine 5 mg daily ).   Pt states she does not check blood pressure regularly but will check blood pressure if she goes to pharmacy or [employer].   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 Pt states that about 3 days ago, she believes she slept wrong. Pt states she is having right neck and shoulder pain; pain no going into arm and the pain wakes her up at night. Pt has tried [employer]. Denies any injury.  Denies any weakness Virtual Visit via Telephone Note  I connected with Robin Dixon on 12/23/19 at  8:20 AM EDT by telephone and verified that I am speaking with the correct person using two identifiers.  Location: Patient: home Provider: office   I discussed the limitations, risks, security and privacy concerns of performing an evaluation and management service by telephone and the availability of in person appointments. I also discussed with the patient that there may be a patient responsible charge related to this service. The patient expressed understanding and agreed to proceed.   History of Present Illness:    Observations/Objective:   Assessment and Plan:   Follow Up Instructions:    I discussed the assessment and treatment plan with the patient. The patient was provided an opportunity to ask questions and all were answered. The patient agreed with the plan and demonstrated an understanding of the  instructions.   The patient was advised to call back or seek an in-person evaluation if the symptoms worsen or if the condition fails to improve as anticipated.  I provided 22 minutes of non-face-to-face time during this encounter.        Review of Systems  Constitutional: Negative for activity change, appetite change and fatigue.  HENT: Negative for congestion and rhinorrhea.   Respiratory: Negative for cough and shortness of breath.   Cardiovascular: Negative for chest pain and leg swelling.  Gastrointestinal: Negative for abdominal pain and diarrhea.  Endocrine: Negative for polydipsia and polyphagia.  Musculoskeletal: Positive for back pain. Negative for arthralgias.  Skin: Negative for color change.  Neurological: Negative for dizziness and weakness.  Psychiatric/Behavioral: Negative for behavioral problems and confusion.       Objective:   Physical Exam Virtual visit physical exam not possible       Assessment & Plan:  1. Essential hypertension Blood pressure under good control watches salt in the diet takes medication and stays active  2. Insomnia, unspecified type Has difficult time sleeping except for taking the trazodone which does help denies any setbacks or problems  3. GAD (generalized anxiety disorder) Relates typically takes his Xanax approximately twice daily denies abusing it states it does help keep her symptoms under control  4. Vitamin D deficiency She does a decent job with staying healthy with her diet uncertain of her vitamin D we will check level in the past its been low  5. Hyperlipidemia, unspecified hyperlipidemia type Significant hyperlipidemia watch diet stay active check lab work  Neck  pain with some radiation into the shoulder possible cervical impingement range of motion exercises discussed may use OTC anti-inflammatory for up to 7 to 10 days if ongoing troubles or problems notify us

## 2019-12-26 ENCOUNTER — Other Ambulatory Visit: Payer: Self-pay | Admitting: Family Medicine

## 2019-12-26 MED ORDER — PREDNISONE 20 MG PO TABS: ORAL_TABLET | ORAL | 0 refills | Status: DC

## 2020-02-12 ENCOUNTER — Other Ambulatory Visit: Payer: Self-pay | Admitting: Gastroenterology

## 2020-04-01 ENCOUNTER — Other Ambulatory Visit (HOSPITAL_COMMUNITY): Payer: Self-pay | Admitting: Family Medicine

## 2020-04-01 DIAGNOSIS — Z1231 Encounter for screening mammogram for malignant neoplasm of breast: Secondary | ICD-10-CM

## 2020-04-13 ENCOUNTER — Other Ambulatory Visit: Payer: Self-pay

## 2020-04-13 ENCOUNTER — Ambulatory Visit (HOSPITAL_COMMUNITY)
Admission: RE | Admit: 2020-04-13 | Discharge: 2020-04-13 | Disposition: A | Discharge status: Home / Self Care | Payer: BC Managed Care – PPO | Source: Ambulatory Visit | Attending: Family Medicine | Admitting: Family Medicine

## 2020-04-13 DIAGNOSIS — Z1231 Encounter for screening mammogram for malignant neoplasm of breast: Secondary | ICD-10-CM | POA: Diagnosis not present

## 2020-04-14 LAB — HEPATIC FUNCTION PANEL
ALT: 16 IU/L (ref 0–32)
AST: 23 IU/L (ref 0–40)
Albumin: 4.7 g/dL (ref 3.8–4.9)
Alkaline Phosphatase: 74 IU/L (ref 48–121)
Bilirubin Total: 0.3 mg/dL (ref 0.0–1.2)
Bilirubin, Direct: 0.11 mg/dL (ref 0.00–0.40)
Total Protein: 6.9 g/dL (ref 6.0–8.5)

## 2020-04-14 LAB — BASIC METABOLIC PANEL
BUN/Creatinine Ratio: 14 (ref 9–23)
BUN: 11 mg/dL (ref 6–24)
CO2: 24 mmol/L (ref 20–29)
Calcium: 9.5 mg/dL (ref 8.7–10.2)
Chloride: 102 mmol/L (ref 96–106)
Creatinine, Ser: 0.79 mg/dL (ref 0.57–1.00)
GFR calc Af Amer: 97 mL/min/{1.73_m2} (ref >59)
GFR calc non Af Amer: 84 mL/min/{1.73_m2} (ref >59)
Glucose: 85 mg/dL (ref 65–99)
Potassium: 4.2 mmol/L (ref 3.5–5.2)
Sodium: 143 mmol/L (ref 134–144)

## 2020-04-14 LAB — LIPID PANEL
Chol/HDL Ratio: 3.5 ratio (ref 0.0–4.4)
Cholesterol, Total: 254 mg/dL — ABNORMAL HIGH (ref 100–199)
HDL: 72 mg/dL (ref >39)
LDL Chol Calc (NIH): 158 mg/dL — ABNORMAL HIGH (ref 0–99)
Triglycerides: 136 mg/dL (ref 0–149)
VLDL Cholesterol Cal: 24 mg/dL (ref 5–40)

## 2020-04-14 LAB — VITAMIN D 25 HYDROXY (VIT D DEFICIENCY, FRACTURES): Vit D, 25-Hydroxy: 26.7 ng/mL — ABNORMAL LOW (ref 30.0–100.0)

## 2020-04-22 ENCOUNTER — Telehealth: Payer: Self-pay | Admitting: Family Medicine

## 2020-04-22 NOTE — Telephone Encounter (Signed)
Patient had to pick up sister from hospital who was just getting over Covid . She put her up today and carried her home ,they both had on mask. Patient has had both of her shots but sister hadnt. Should she quarantine from her family and if so how many days. Please advise

## 2020-04-22 NOTE — Telephone Encounter (Signed)
Patient notified and verbalized understanding. 

## 2020-04-22 NOTE — Telephone Encounter (Signed)
Recommend caution but for providing a ride home and wearing a mask in for [place] already having the vaccine, she would not have to quarantine  If she is feeling nervous about all of that then I would recommend self quarantining for 7 days and if no symptoms then may be around family

## 2020-06-17 ENCOUNTER — Other Ambulatory Visit: Payer: Self-pay | Admitting: Family Medicine

## 2020-06-18 NOTE — Telephone Encounter (Signed)
Last med check up 12/23/19

## 2020-06-18 NOTE — Telephone Encounter (Signed)
May have 90-day on each no refills recommend follow-up office visit this fall

## 2020-06-26 ENCOUNTER — Other Ambulatory Visit: Payer: Self-pay | Admitting: Gastroenterology

## 2020-06-26 ENCOUNTER — Other Ambulatory Visit: Payer: Self-pay | Admitting: Family Medicine

## 2020-06-29 NOTE — Telephone Encounter (Signed)
Patient has upcoming appt on 07-08-20 please refill until appt.

## 2020-06-29 NOTE — Telephone Encounter (Signed)
Left message to schedule

## 2020-06-30 ENCOUNTER — Telehealth: Payer: Self-pay

## 2020-06-30 NOTE — Telephone Encounter (Signed)
She needs to go to the emergency department for evaluation and probable rabies vaccination

## 2020-06-30 NOTE — Telephone Encounter (Signed)
Wild chipmunk was in her garage and when she tried to catch it he bit her thumb.  Bled some but not a lot.  Does she need to do anything?

## 2020-07-01 NOTE — Telephone Encounter (Signed)
Pt contacted and verbalized understanding.  

## 2020-07-02 ENCOUNTER — Other Ambulatory Visit: Payer: Self-pay

## 2020-07-02 ENCOUNTER — Encounter (HOSPITAL_COMMUNITY): Payer: Self-pay | Admitting: Emergency Medicine

## 2020-07-02 ENCOUNTER — Emergency Department (HOSPITAL_COMMUNITY)
Admission: EM | Admit: 2020-07-02 | Discharge: 2020-07-02 | Disposition: A | Discharge status: Home / Self Care | Payer: BC Managed Care – PPO | Attending: Emergency Medicine | Admitting: Emergency Medicine

## 2020-07-02 DIAGNOSIS — Z203 Contact with and (suspected) exposure to rabies: Secondary | ICD-10-CM | POA: Insufficient documentation

## 2020-07-02 DIAGNOSIS — W5321XA Bitten by squirrel, initial encounter: Secondary | ICD-10-CM | POA: Diagnosis not present

## 2020-07-02 DIAGNOSIS — I10 Essential (primary) hypertension: Secondary | ICD-10-CM | POA: Insufficient documentation

## 2020-07-02 DIAGNOSIS — S61032A Puncture wound without foreign body of left thumb without damage to nail, initial encounter: Secondary | ICD-10-CM | POA: Insufficient documentation

## 2020-07-02 DIAGNOSIS — Z79899 Other long term (current) drug therapy: Secondary | ICD-10-CM | POA: Insufficient documentation

## 2020-07-02 DIAGNOSIS — Z23 Encounter for immunization: Secondary | ICD-10-CM | POA: Diagnosis not present

## 2020-07-02 DIAGNOSIS — Z87891 Personal history of nicotine dependence: Secondary | ICD-10-CM | POA: Diagnosis not present

## 2020-07-02 DIAGNOSIS — T148XXA Other injury of unspecified body region, initial encounter: Secondary | ICD-10-CM

## 2020-07-02 DIAGNOSIS — Z2914 Encounter for prophylactic rabies immune globin: Secondary | ICD-10-CM | POA: Diagnosis not present

## 2020-07-02 MED ORDER — RABIES IMMUNE GLOBULIN 150 UNIT/ML IM INJ
20.0000 [IU]/kg | INJECTION | Freq: Once | INTRAMUSCULAR | Status: AC
  Administered 2020-07-02: 1575 [IU] via INTRAMUSCULAR
  Filled 2020-07-02: qty 12

## 2020-07-02 MED ORDER — RABIES VACCINE, PCEC IM SUSR
1.0000 mL | Freq: Once | INTRAMUSCULAR | Status: AC
  Administered 2020-07-02: 1 mL via INTRAMUSCULAR
  Filled 2020-07-02: qty 1

## 2020-07-02 MED ORDER — TETANUS-DIPHTH-ACELL PERTUSSIS 5-2.5-18.5 LF-MCG/0.5 IM SUSP
0.5000 mL | Freq: Once | INTRAMUSCULAR | Status: AC
  Administered 2020-07-02: 0.5 mL via INTRAMUSCULAR
  Filled 2020-07-02: qty 0.5

## 2020-07-02 MED ORDER — AMOXICILLIN-POT CLAVULANATE 875-125 MG PO TABS: 1.0000 | ORAL_TABLET | Freq: Two times a day (BID) | ORAL | 0 refills | Status: AC

## 2020-07-02 NOTE — ED Provider Notes (Signed)
[facility] EMERGENCY DEPARTMENT Provider Note   CSN: [id number] Arrival date & time: 07/02/20  1002     History No chief complaint on file.   Robin Dixon is a 54 y.o. female.  HPI   54 year old female with a history of anxiety, hyperlipidemia, hypertension, depression, presents the emergency department today for evaluation of an animal bite.  States 2 days ago her cat brought in a chipmunk.  She tried to take the chipmunk back outside but it bit her in the left thumb.  She called her doctor who told her she need to get the rabies vaccination so she is here to get her vaccines.  She not sure when her last Tdap was.  States that the wound is healing well.  There is no erythema or significant pain.  No fevers.  Past Medical History:  Diagnosis Date  . Anxiety 03/29/2013  . Carpal tunnel syndrome 07/02/2015   Right  . History of neck injury   . Hot flashes 03/29/2013  . Hyperlipidemia 12/23/2019  . Hypertension   . Mental disorder    anxiety depression  . PMB (postmenopausal bleeding) 06/12/2013   US WNL    Patient Active Problem List   Diagnosis Date Noted  . Hyperlipidemia 12/23/2019  . Nausea without vomiting 01/10/2019  . Right upper quadrant abdominal pain 08/29/2018  . GAD (generalized anxiety disorder) 05/07/2018  . Encounter for well woman exam with routine gynecological exam 06/19/2017  . Gastroesophageal reflux disease 06/19/2017  . Vitamin D deficiency 03/25/2017  . Chronic constipation 09/03/2016  . Insomnia 09/02/2016  . Carpal tunnel syndrome of right wrist   . Carpal tunnel syndrome 07/02/2015  . Dyspepsia 12/26/2013  . Colon cancer screening 12/26/2013  . Gastritis and gastroduodenitis 11/19/2013  . PMB (postmenopausal bleeding) 06/12/2013  . Postmenopausal HRT (hormone replacement therapy) 05/03/2013  . Hypertension 03/29/2013  . Anxiety 03/29/2013  . Hot flashes 03/29/2013    Past Surgical History:  Procedure Laterality Date  . CARPAL TUNNEL  RELEASE Right 09/23/2015   Procedure: RIGHT CARPAL TUNNEL RELEASE;  Surgeon: Vickki Hearing, MD;  Location: [facility] ORS;  Service: Orthopedics;  Laterality: Right;  . CHOLECYSTECTOMY     [place]  . COLONOSCOPY N/A 12/27/2013   Procedure: COLONOSCOPY;  Surgeon: West Bali, MD;  Location: [facility] END[facility] SUITE;  Service: Endoscopy;  Laterality: N/A;  1:45  . ESOPHAGOGASTRODUODENOSCOPY N/A 12/27/2013   Procedure: ESOPHAGOGASTRODUODENOSCOPY (EGD);  Surgeon: West Bali, MD;  Location: [facility];  Service: Endoscopy;  Laterality: N/A;     OB History    Gravida  0   Para  0   Term  0   Preterm  0   AB  0   Living  0     SAB  0   TAB  0   Ectopic  0   Multiple  0   Live Births              Family History  Problem Relation Age of Onset  . Diabetes Mother   . Hypertension Mother   . Stroke Mother   . Cancer Mother        blood  . Kidney Stones Mother   . COPD Sister   . Hypertension Sister   . Cancer Brother        melanoma  . Hypertension Sister   . Colon cancer Neg Hx   . Colon polyps Neg Hx     Social History   Tobacco Use  .  Smoking status: Former Smoker    Years: 15.00    Types: Cigarettes    Quit date: 02/22/1998    Years since quitting: 22.3  . Smokeless tobacco: Never Used  . Tobacco comment: quit years ago  Substance Use Topics  . Alcohol use: Yes    Comment: occ  . Drug use: No    Home Medications Prior to Admission medications   Medication Sig Start Date End Date Taking? Authorizing Provider  ALPRAZolam Prudy Feeler) 1 MG tablet Take 1 tablet by mouth twice daily as needed 06/30/20   [name], MD  amLODipine (NORVASC) 5 MG tablet Take 1 tablet by mouth once daily 06/18/20   [name], MD  amoxicillin-clavulanate (AUGMENTIN) 875-125 MG tablet Take 1 tablet by mouth 2 (two) times daily for 7 days. 07/02/20 07/09/20  Japneet Staggs S, PA-C  dexlansoprazole (DEXILANT) 60 MG capsule Take 1 capsule (60 mg total) by mouth daily.  12/23/19   [name], MD  dicyclomine (BENTYL) 10 MG capsule TAKE 1 TO 2 CAPSULES BY MOUTH 30 MINUTES BEFORE MEAL(S) OR EVERY 4 HOURS TO CONTROL DIARRHEA, NO MORE THAN 8 CAPSULES PER DAY 06/29/20   Anice Paganini, NP  ondansetron (ZOFRAN) 8 MG tablet Take 1 tablet (8 mg total) by mouth every 8 (eight) hours as needed for nausea or vomiting. 12/10/19   Fields, Darleene Cleaver, MD  predniSONE (DELTASONE) 20 MG tablet Take 3 tablets a day po for 3 days, then 2 tablets po for 3 days then one tablet po for 3 days 12/26/19   [name], MD  sertraline (ZOLOFT) 100 MG tablet Take 2 tablets by mouth once daily 06/18/20   [name], MD  traZODone (DESYREL) 50 MG tablet Take 1 tablet (50 mg total) by mouth at bedtime as needed for sleep. 12/23/19   [name], MD    Allergies    Keflex [cephalexin]  Review of Systems   Review of Systems  Constitutional: Negative for fever.  Musculoskeletal:       Bite to finger  Skin: Positive for wound.  Neurological: Negative for weakness and numbness.    Physical Exam Updated Vital Signs BP (!) 137/118 (BP Location: Right Arm)   Pulse 62   Temp 97.8 F (36.6 C) (Oral)   Resp 18   Ht 5\' 6"  (1.676 m)   Wt 77.1 kg   SpO2 100%   BMI 27.44 kg/m   Physical Exam Vitals and nursing note reviewed.  Constitutional:      General: She is not in acute distress.    Appearance: She is well-developed.  HENT:     Head: Normocephalic and atraumatic.  Eyes:     Conjunctiva/sclera: Conjunctivae normal.  Cardiovascular:     Rate and Rhythm: Normal rate.  Pulmonary:     Effort: Pulmonary effort is normal.  Musculoskeletal:        General: Normal range of motion.     Cervical back: Neck supple.  Skin:    General: Skin is warm and dry.     Comments: Small superficial puncture wound to the left thumb without any signs of infection  Neurological:     Mental Status: She is alert.     ED Results / Procedures / Treatments   Labs (all labs ordered are  listed, but only abnormal results are displayed) Labs Reviewed - No data to display  EKG None  Radiology No results found.  Procedures Procedures (including critical care time)  Medications Ordered in ED Medications  Tdap (BOOSTRIX) injection 0.5 mL (has no administration in time range)  rabies vaccine (RABAVERT) injection 1 mL (has no administration in time range)  rabies immune globulin (HYPERAB/KEDRAB) injection 1,575 Units (has no administration in time range)    ED Course  I have reviewed the triage vital signs and the nursing notes.  Pertinent labs & imaging results that were available during my care of the patient were reviewed by me and considered in my medical decision making (see chart for details).    MDM Rules/Calculators/A&P                          Patient presents with puncture wound from chipmunk bite. It is well healing on exam but given high risk of infection, rx for abx given. Wounds examined with visualization of the base and no foreign bodies seen.  Pt Alert and oriented, NAD, nontoxic, nonseptic appearing.  Capillary refill intact and pt without neurologic deficit. Patient tetanus updated.  Patient rabies vaccine and immunoglobulin risk and benefit discussed.  Pt consents. Pain treated in the emergency department. Wounds not closed secondary to concern for infection. We'll discharge home with requests for close follow-up with PCP or back in the ER.   Final Clinical Impression(s) / ED Diagnoses Final diagnoses:  Animal bite    Rx / DC Orders ED Discharge Orders         Ordered    amoxicillin-clavulanate (AUGMENTIN) 875-125 MG tablet  2 times daily        07/02/20 [address], [place], PA-C 07/02/20 1308    Maia Plan, MD 07/06/20 1647

## 2020-07-02 NOTE — ED Triage Notes (Signed)
Patient was bit by a chipmuck on her left thumb on Tuesday.  Patient her for rabies vaccine.

## 2020-07-02 NOTE — Discharge Instructions (Signed)
You were given a prescription for antibiotics. Please take the antibiotic prescription fully.   Follow up as directed to get the remainder of your vaccine series.   Please follow up with your primary care provider within 5-7 days for re-evaluation of your symptoms. If you do not have a primary care provider, information for a healthcare clinic has been provided for you to make arrangements for follow up care. Please return to the emergency department for any new or worsening symptoms.

## 2020-07-05 ENCOUNTER — Other Ambulatory Visit: Payer: Self-pay

## 2020-07-05 ENCOUNTER — Ambulatory Visit (HOSPITAL_COMMUNITY)
Admission: EM | Admit: 2020-07-05 | Discharge: 2020-07-05 | Disposition: A | Discharge status: Home / Self Care | Payer: BC Managed Care – PPO | Attending: Family Medicine | Admitting: Family Medicine

## 2020-07-05 ENCOUNTER — Encounter (HOSPITAL_COMMUNITY): Payer: Self-pay | Admitting: Emergency Medicine

## 2020-07-05 DIAGNOSIS — Z203 Contact with and (suspected) exposure to rabies: Secondary | ICD-10-CM

## 2020-07-05 MED ORDER — RABIES VACCINE, PCEC IM SUSR
INTRAMUSCULAR | Status: AC
  Filled 2020-07-05: qty 1

## 2020-07-05 MED ORDER — RABIES VACCINE, PCEC IM SUSR
1.0000 mL | Freq: Once | INTRAMUSCULAR | Status: AC
  Administered 2020-07-05: 1 mL via INTRAMUSCULAR

## 2020-07-05 NOTE — ED Triage Notes (Signed)
Pt presents for 2nd rabies vaccines. Denies any pain.

## 2020-07-08 ENCOUNTER — Ambulatory Visit: Payer: BC Managed Care – PPO | Admitting: Family Medicine

## 2020-07-08 ENCOUNTER — Other Ambulatory Visit: Payer: Self-pay

## 2020-07-08 ENCOUNTER — Encounter: Payer: Self-pay | Admitting: Family Medicine

## 2020-07-08 VITALS — BP 118/70 | HR 68 | Temp 98.1°F | Ht 66.0 in | Wt 173.0 lb

## 2020-07-08 DIAGNOSIS — F411 Generalized anxiety disorder: Secondary | ICD-10-CM | POA: Diagnosis not present

## 2020-07-08 DIAGNOSIS — E785 Hyperlipidemia, unspecified: Secondary | ICD-10-CM | POA: Diagnosis not present

## 2020-07-08 DIAGNOSIS — Z23 Encounter for immunization: Secondary | ICD-10-CM | POA: Diagnosis not present

## 2020-07-08 DIAGNOSIS — I10 Essential (primary) hypertension: Secondary | ICD-10-CM | POA: Diagnosis not present

## 2020-07-08 MED ORDER — DEXILANT 60 MG PO CPDR
1.0000 | DELAYED_RELEASE_CAPSULE | Freq: Every day | ORAL | 1 refills | Status: DC
Start: 2020-07-08 — End: 2020-11-23

## 2020-07-08 MED ORDER — SERTRALINE HCL 100 MG PO TABS: ORAL_TABLET | ORAL | 1 refills | Status: DC

## 2020-07-08 MED ORDER — ALPRAZOLAM 1 MG PO TABS
1.0000 mg | ORAL_TABLET | Freq: Two times a day (BID) | ORAL | 5 refills | Status: DC | PRN
Start: 2020-07-08 — End: 2021-01-20

## 2020-07-08 MED ORDER — TRAZODONE HCL 50 MG PO TABS: 50.0000 mg | ORAL_TABLET | Freq: Every evening | ORAL | 1 refills | Status: DC | PRN

## 2020-07-08 MED ORDER — AMLODIPINE BESYLATE 5 MG PO TABS
5.0000 mg | ORAL_TABLET | Freq: Every day | ORAL | 1 refills | Status: DC
Start: 2020-07-08 — End: 2020-09-16

## 2020-07-08 NOTE — Progress Notes (Signed)
   Subjective:    Patient ID: Robin Dixon, female    DOB: 1966-02-29, 54 y.o.   MRN: [id number]  HPImed check up.  Need for vaccination - Plan: Flu Vaccine QUAD 6+ mos PF IM (Fluarix Quad PF)  GAD (generalized anxiety disorder)  Primary hypertension  Hyperlipidemia, unspecified hyperlipidemia type  Patient uses Xanax I encourage her to try to taper down on this to go to a lower dose if possible she states that she will try  Blood pressure takes her medicine regular basis watches salt in her diet  Her moods overall are doing well on the Zoloft she continues this currently.  Pt states no concerns today. phq9 and gad 7 done.     Review of Systems  Constitutional: Negative for activity change, appetite change and fatigue.  HENT: Negative for congestion and rhinorrhea.   Respiratory: Negative for cough and shortness of breath.   Cardiovascular: Negative for chest pain and leg swelling.  Gastrointestinal: Negative for abdominal pain and diarrhea.  Endocrine: Negative for polydipsia and polyphagia.  Skin: Negative for color change.  Neurological: Negative for dizziness and weakness.  Psychiatric/Behavioral: Negative for behavioral problems and confusion.       Objective:   Physical Exam Vitals reviewed.  Constitutional:      General: She is not in acute distress. HENT:     Head: Normocephalic and atraumatic.  Eyes:     General:        Right eye: No discharge.        Left eye: No discharge.  Neck:     Trachea: No tracheal deviation.  Cardiovascular:     Rate and Rhythm: Normal rate and regular rhythm.     Heart sounds: Normal heart sounds. No murmur heard.   Pulmonary:     Effort: Pulmonary effort is normal. No respiratory distress.     Breath sounds: Normal breath sounds.  Lymphadenopathy:     Cervical: No cervical adenopathy.  Skin:    General: Skin is warm and dry.  Neurological:     Mental Status: She is alert.     Coordination: Coordination normal.    Psychiatric:        Behavior: Behavior normal.           Assessment & Plan:  1. Need for vaccination Flu shot today - Flu Vaccine QUAD 6+ mos PF IM (Fluarix Quad PF)  2. GAD (generalized anxiety disorder) On Zoloft also on Xanax she is going to try to taper down on the Xanax to a smaller amount of possible  3. Primary hypertension Blood pressure good continue current measures  4. Hyperlipidemia, unspecified hyperlipidemia type Watching diet staying physically active

## 2020-07-09 ENCOUNTER — Ambulatory Visit
Admission: EM | Admit: 2020-07-09 | Discharge: 2020-07-09 | Disposition: A | Discharge status: Home / Self Care | Payer: BC Managed Care – PPO | Attending: Emergency Medicine | Admitting: Emergency Medicine

## 2020-07-09 ENCOUNTER — Encounter: Payer: Self-pay | Admitting: Emergency Medicine

## 2020-07-09 DIAGNOSIS — Z203 Contact with and (suspected) exposure to rabies: Secondary | ICD-10-CM

## 2020-07-09 MED ORDER — RABIES VACCINE, PCEC IM SUSR
1.0000 mL | Freq: Once | INTRAMUSCULAR | Status: AC
  Administered 2020-07-09: 1 mL via INTRAMUSCULAR

## 2020-07-09 NOTE — ED Triage Notes (Signed)
Needs rabies vaccine 

## 2020-07-16 ENCOUNTER — Other Ambulatory Visit: Payer: Self-pay

## 2020-07-16 ENCOUNTER — Ambulatory Visit
Admission: EM | Admit: 2020-07-16 | Discharge: 2020-07-16 | Disposition: A | Discharge status: Home / Self Care | Payer: BC Managed Care – PPO | Attending: Emergency Medicine | Admitting: Emergency Medicine

## 2020-07-16 DIAGNOSIS — Z203 Contact with and (suspected) exposure to rabies: Secondary | ICD-10-CM | POA: Diagnosis not present

## 2020-07-16 DIAGNOSIS — Z23 Encounter for immunization: Secondary | ICD-10-CM | POA: Diagnosis not present

## 2020-07-16 DIAGNOSIS — Z2914 Encounter for prophylactic rabies immune globin: Secondary | ICD-10-CM | POA: Diagnosis not present

## 2020-07-16 MED ORDER — RABIES VACCINE, PCEC IM SUSR
1.0000 mL | Freq: Once | INTRAMUSCULAR | Status: AC
  Administered 2020-07-16: 1 mL via INTRAMUSCULAR

## 2020-07-16 NOTE — ED Triage Notes (Signed)
Pt here for final rabies shot

## 2020-07-30 ENCOUNTER — Ambulatory Visit: Payer: BC Managed Care – PPO | Attending: Internal Medicine

## 2020-07-30 DIAGNOSIS — Z23 Encounter for immunization: Secondary | ICD-10-CM

## 2020-07-30 NOTE — Progress Notes (Signed)
   Covid-19 Vaccination Clinic  Name:  Robin Dixon    MRN: [id number] DOB: 1966-08-04  07/30/2020  [name] was observed post Covid-19 immunization for 15 minutes without incident. She was provided with Vaccine Information Sheet and instruction to access the V-Safe system.   Ms. Chamber was instructed to call 911 with any severe reactions post vaccine: Marland Kitchen Difficulty breathing  . Swelling of face and throat  . A fast heartbeat  . A bad rash all over body  . Dizziness and weakness

## 2020-08-12 ENCOUNTER — Encounter: Payer: Self-pay | Admitting: Internal Medicine

## 2020-09-16 ENCOUNTER — Other Ambulatory Visit: Payer: Self-pay | Admitting: Family Medicine

## 2020-10-22 ENCOUNTER — Encounter: Payer: Self-pay | Admitting: Family Medicine

## 2020-11-06 ENCOUNTER — Encounter: Payer: Self-pay | Admitting: Nurse Practitioner

## 2020-11-06 ENCOUNTER — Telehealth (INDEPENDENT_AMBULATORY_CARE_PROVIDER_SITE_OTHER): Payer: BC Managed Care – PPO | Admitting: Nurse Practitioner

## 2020-11-06 ENCOUNTER — Other Ambulatory Visit: Payer: Self-pay

## 2020-11-06 DIAGNOSIS — B9689 Other specified bacterial agents as the cause of diseases classified elsewhere: Secondary | ICD-10-CM | POA: Diagnosis not present

## 2020-11-06 DIAGNOSIS — J019 Acute sinusitis, unspecified: Secondary | ICD-10-CM | POA: Diagnosis not present

## 2020-11-06 MED ORDER — AMOXICILLIN-POT CLAVULANATE 875-125 MG PO TABS: 1.0000 | ORAL_TABLET | Freq: Two times a day (BID) | ORAL | 0 refills | Status: DC

## 2020-11-06 NOTE — Progress Notes (Signed)
   Subjective:    Patient ID: Robin Dixon, female    DOB: 12-Jul-1966, 55 y.o.   MRN: [id number]  HPIheadache and sinus congestion for over one month. Tried otc sinus meds, ibuprofen. Has not have a covid test since symptoms started.   Virtual Visit via Telephone Note  I connected with Robin Dixon on 11/06/20 at 11:20 AM EST by telephone and verified that I am speaking with the correct person using two identifiers.  Location: Patient: home Provider: office   I discussed the limitations, risks, security and privacy concerns of performing an evaluation and management service by telephone and the availability of in person appointments. I also discussed with the patient that there may be a patient responsible charge related to this service. The patient expressed understanding and agreed to proceed.   History of Present Illness: Presents by phone for complaints of sinus symptoms over the past month.  No relief with multiple OTC meds including ibuprofen.  Complaints of head congestion.  No cough or fever.  No sore throat.  Occasional ear pain.  Sinus pressure, more on the right side.  Nasal drainage.   Observations/Objective: Today's visit was via telephone Physical exam was not possible for this visit Alert, oriented.  Assessment and Plan: Problem List Items Addressed This Visit   None   Visit Diagnoses    Acute bacterial rhinosinusitis    -  Primary   Relevant Medications   amoxicillin-clavulanate (AUGMENTIN) 875-125 MG tablet     Meds ordered this encounter  Medications  . amoxicillin-clavulanate (AUGMENTIN) 875-125 MG tablet    Sig: Take 1 tablet by mouth 2 (two) times daily.    Dispense:  20 tablet    Refill:  0    Has taken Augmentin without difficulty    Order Specific Question:   Supervising Provider    Answer:   Lilyan Punt A [[id number]]     Follow Up Instructions: Start antibiotic as directed.  Recommend steroid nasal spray.  Encourage patient to limit use of  Afrin due to possible rebound effects.  Also encourage patient to schedule her wellness exam and Pap smear with our office or gynecology. Return if symptoms worsen or fail to improve.    I discussed the assessment and treatment plan with the patient. The patient was provided an opportunity to ask questions and all were answered. The patient agreed with the plan and demonstrated an understanding of the instructions.   The patient was advised to call back or seek an in-person evaluation if the symptoms worsen or if the condition fails to improve as anticipated.  I provided 15 minutes of non-face-to-face time during this encounter.        Review of Systems     Objective:   Physical Exam        Assessment & Plan:

## 2020-11-20 ENCOUNTER — Other Ambulatory Visit: Payer: Self-pay | Admitting: Family Medicine

## 2020-12-09 ENCOUNTER — Other Ambulatory Visit: Payer: Self-pay | Admitting: Family Medicine

## 2020-12-09 NOTE — Telephone Encounter (Signed)
07/08/20 was last visit

## 2020-12-15 ENCOUNTER — Telehealth: Payer: Self-pay | Admitting: Family Medicine

## 2020-12-15 DIAGNOSIS — F411 Generalized anxiety disorder: Secondary | ICD-10-CM

## 2020-12-15 DIAGNOSIS — I10 Essential (primary) hypertension: Secondary | ICD-10-CM

## 2020-12-17 IMAGING — MG DIGITAL SCREENING BILAT W/ TOMO W/ CAD
6 of 10 series · 6 of 30 positions shown · non-contrast
Comparison: Previous exam(s).

CLINICAL DATA: Screening.

EXAM:
DIGITAL SCREENING BILATERAL MAMMOGRAM WITH TOMO AND CAD

[R MLO synth-2D]
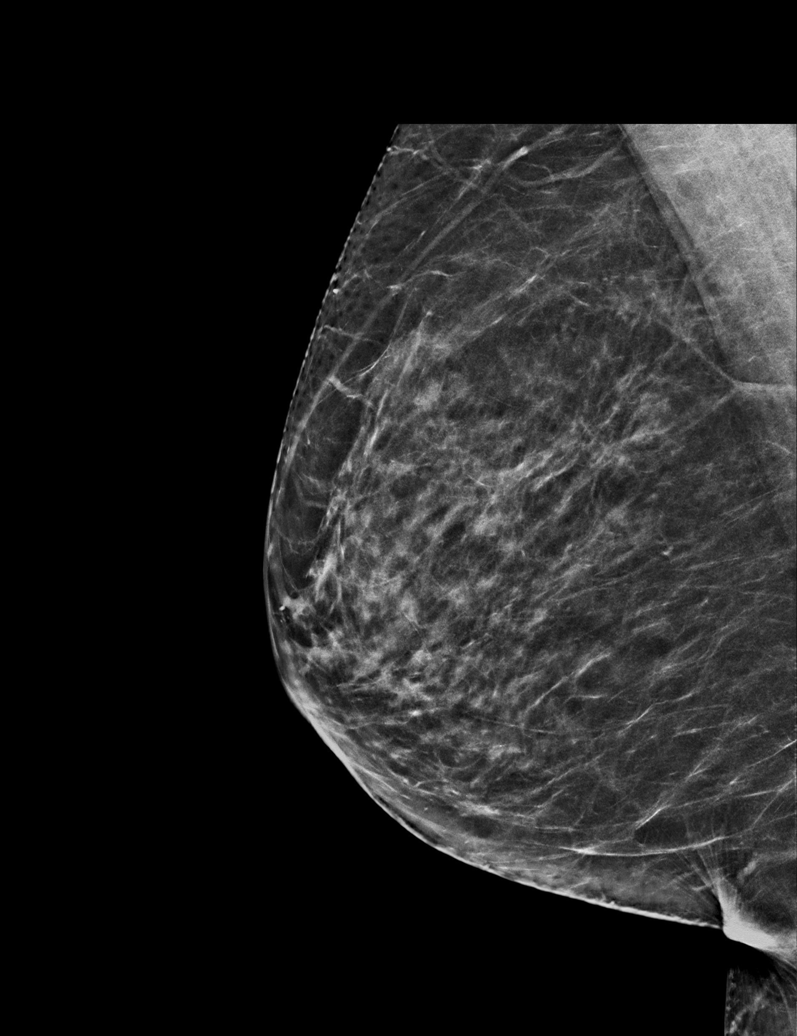

[R CC synth-2D (1 of 2)]
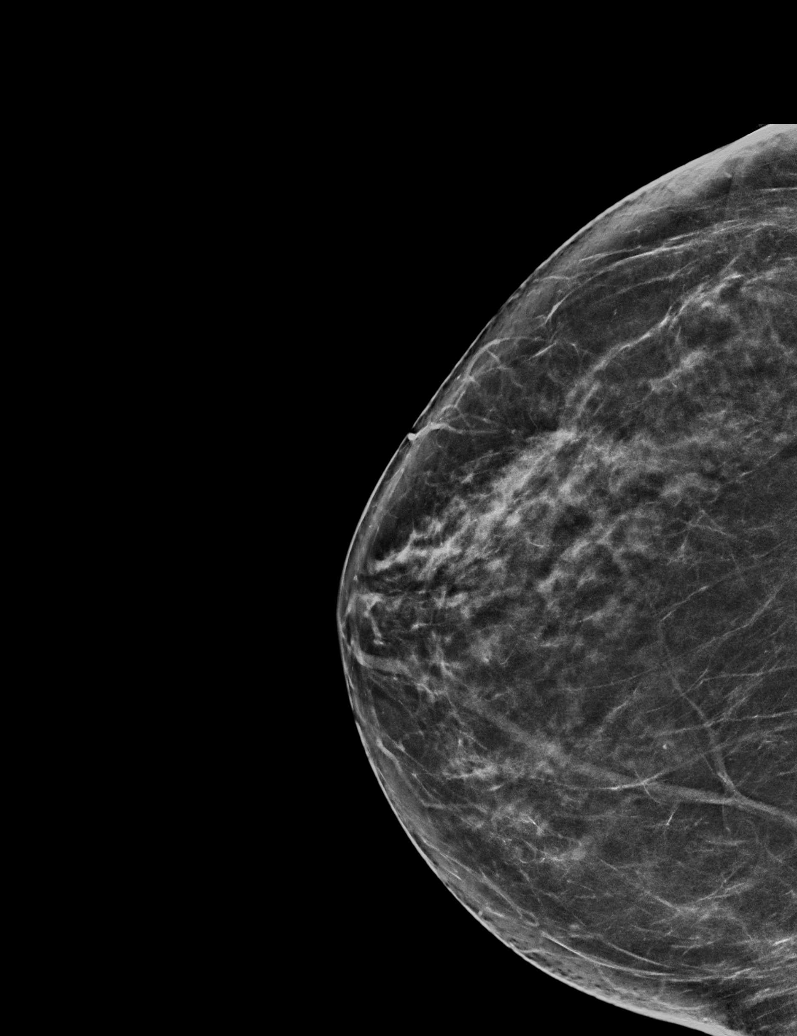

[L MLO synth-2D]
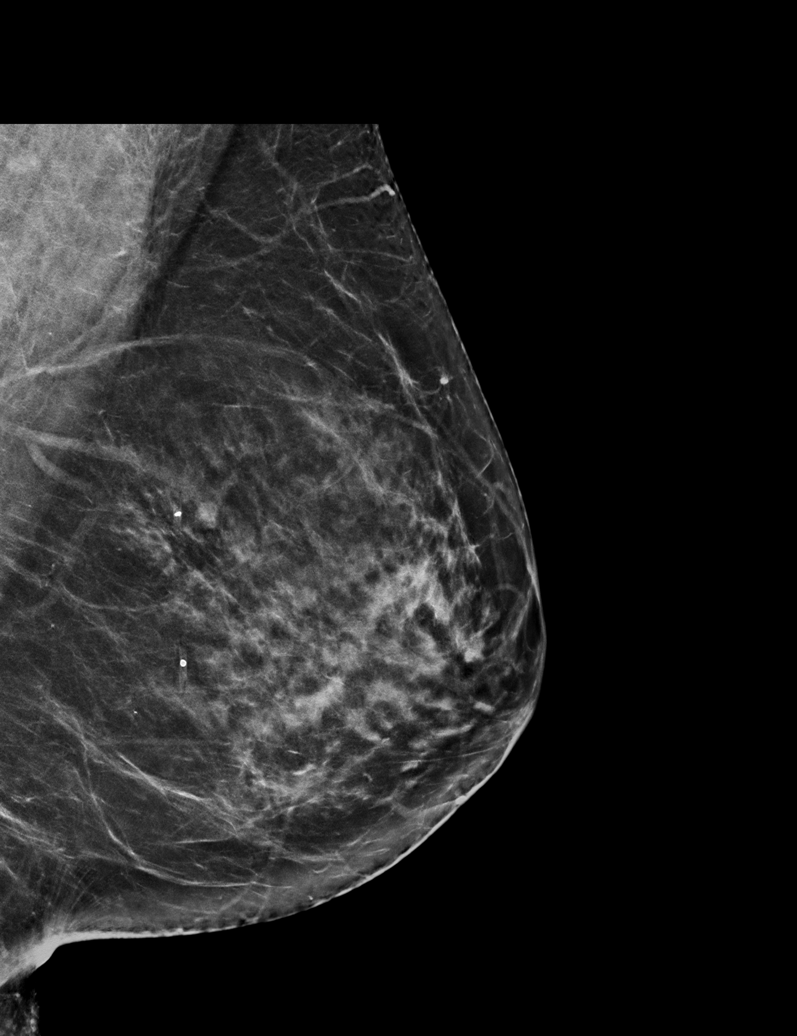

[L CC synth-2D]
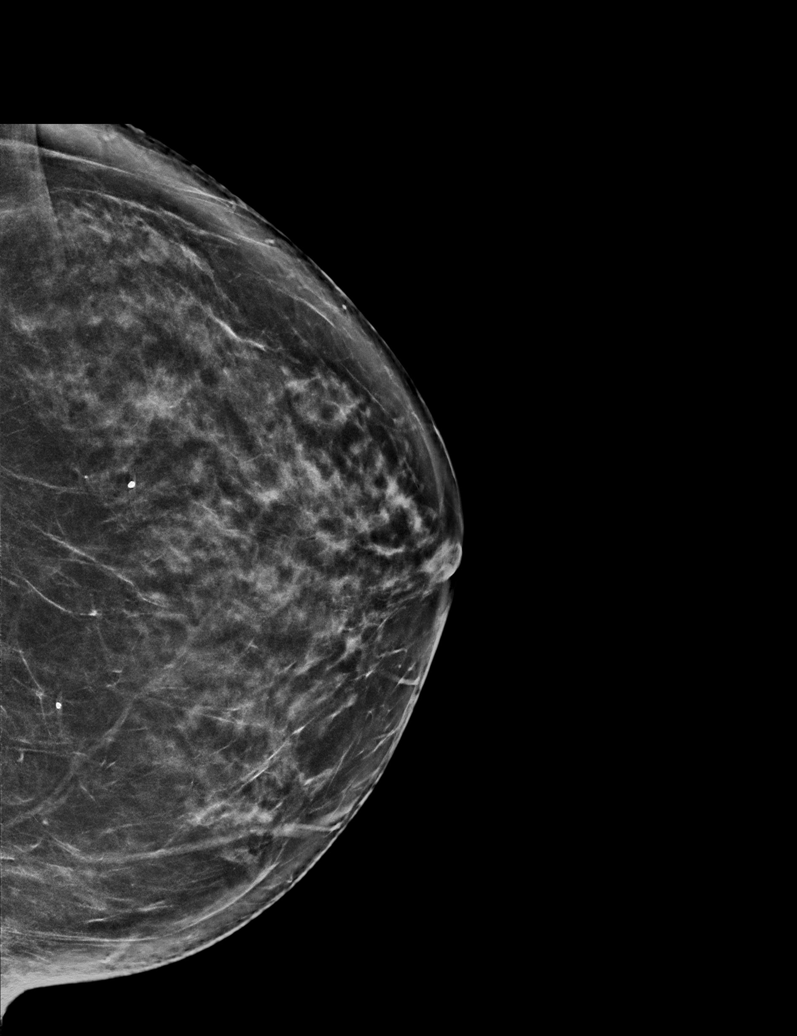

[R CC synth-2D (2 of 2)]
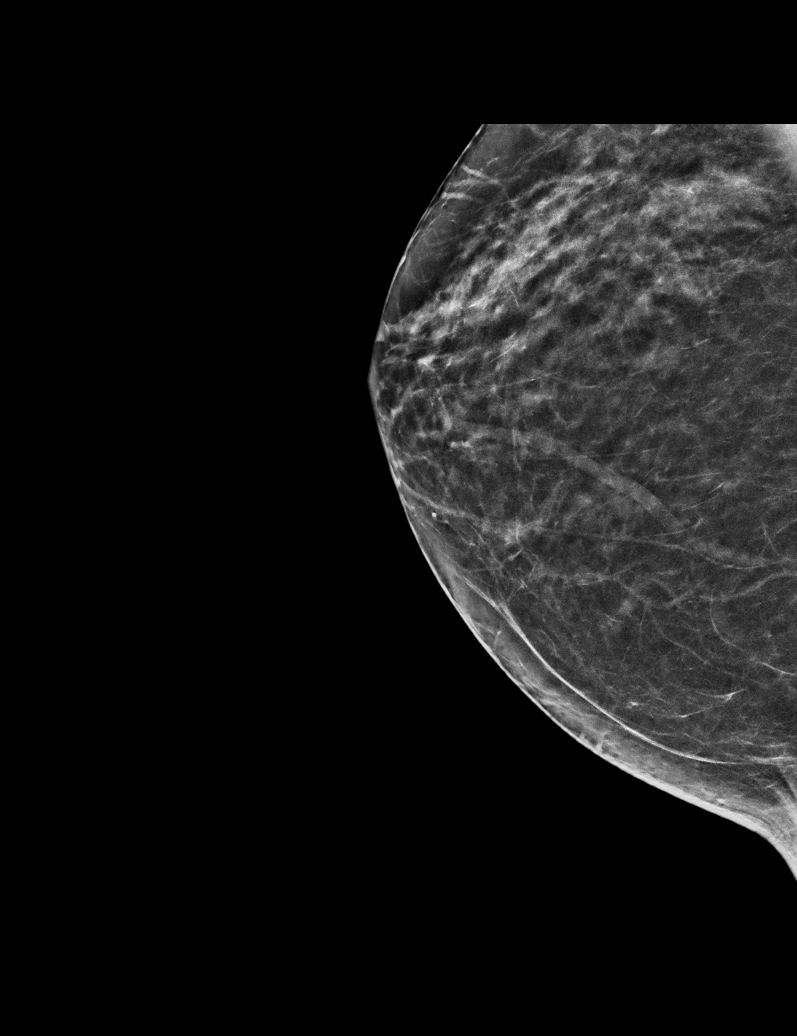

[R CC tomo · tomo slice 35/68.0]
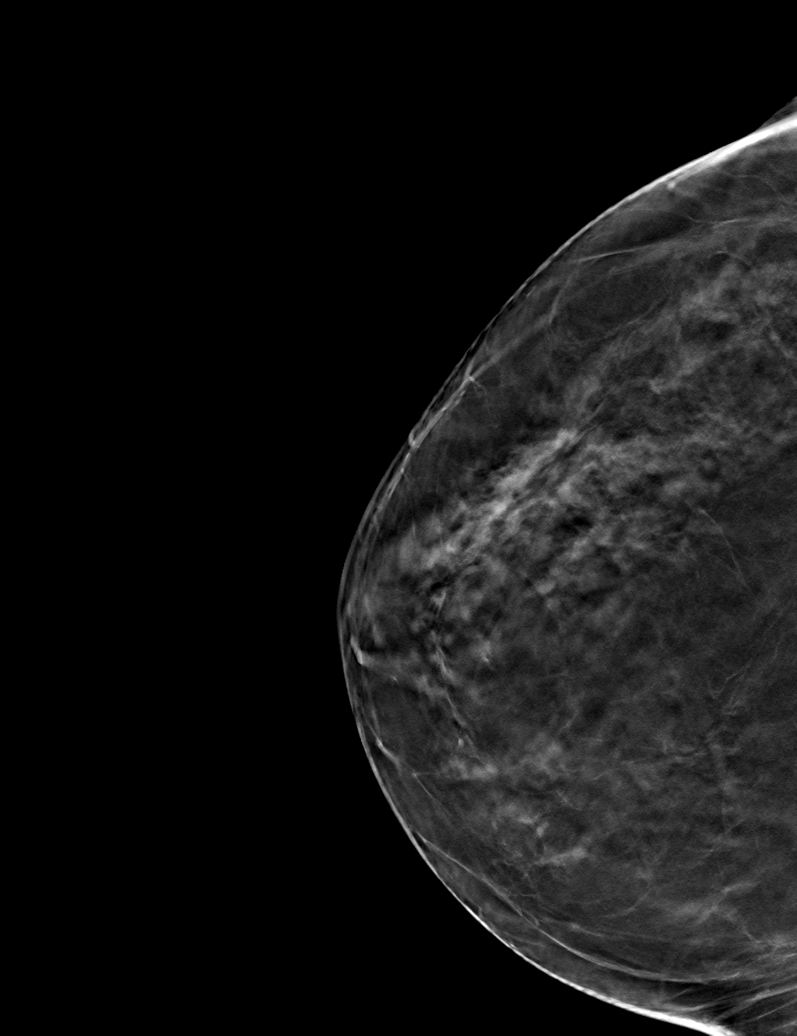

[6 of 30 positions shown; findings below may reference images not displayed]

ACR Breast Density Category b: There are scattered areas of
fibroglandular density.
FINDINGS: There are no findings suspicious for malignancy. Images were
processed with CAD.
IMPRESSION: No mammographic evidence of malignancy. A result letter of this
screening mammogram will be mailed directly to the patient.

RECOMMENDATION:
Screening mammogram in one year. (Code:[id number])

BI-RADS CATEGORY  1: Negative.

## 2020-12-19 ENCOUNTER — Encounter: Payer: Self-pay | Admitting: Family Medicine

## 2020-12-20 NOTE — Telephone Encounter (Signed)
90-day with a refill follow-up in the summer please

## 2020-12-21 ENCOUNTER — Other Ambulatory Visit: Payer: Self-pay | Admitting: *Deleted

## 2020-12-21 DIAGNOSIS — I10 Essential (primary) hypertension: Secondary | ICD-10-CM

## 2020-12-21 DIAGNOSIS — Z79899 Other long term (current) drug therapy: Secondary | ICD-10-CM

## 2020-12-21 DIAGNOSIS — F411 Generalized anxiety disorder: Secondary | ICD-10-CM

## 2020-12-21 DIAGNOSIS — E785 Hyperlipidemia, unspecified: Secondary | ICD-10-CM

## 2020-12-21 MED ORDER — SERTRALINE HCL 100 MG PO TABS: 2.0000 | ORAL_TABLET | Freq: Every day | ORAL | 0 refills | Status: DC

## 2020-12-21 MED ORDER — AMLODIPINE BESYLATE 5 MG PO TABS: 1.0000 | ORAL_TABLET | Freq: Every day | ORAL | 0 refills | Status: DC

## 2020-12-21 NOTE — Telephone Encounter (Signed)
May have 90 day refill on both Needs labs and OV in June Met 7 lipid liver

## 2020-12-24 NOTE — Telephone Encounter (Signed)
Patient has appointment on 5/4 for medication followup

## 2021-01-16 LAB — LIPID PANEL
Chol/HDL Ratio: 3.9 ratio (ref 0.0–4.4)
Cholesterol, Total: 306 mg/dL — ABNORMAL HIGH (ref 100–199)
HDL: 79 mg/dL (ref >39)
LDL Chol Calc (NIH): 196 mg/dL — ABNORMAL HIGH (ref 0–99)
Triglycerides: 170 mg/dL — ABNORMAL HIGH (ref 0–149)
VLDL Cholesterol Cal: 31 mg/dL (ref 5–40)

## 2021-01-16 LAB — HEPATIC FUNCTION PANEL
ALT: 20 IU/L (ref 0–32)
AST: 24 IU/L (ref 0–40)
Albumin: 4.9 g/dL (ref 3.8–4.9)
Alkaline Phosphatase: 71 IU/L (ref 44–121)
Bilirubin Total: 0.4 mg/dL (ref 0.0–1.2)
Bilirubin, Direct: 0.11 mg/dL (ref 0.00–0.40)
Total Protein: 7.3 g/dL (ref 6.0–8.5)

## 2021-01-16 LAB — BASIC METABOLIC PANEL
BUN/Creatinine Ratio: 20 (ref 9–23)
BUN: 18 mg/dL (ref 6–24)
CO2: 26 mmol/L (ref 20–29)
Calcium: 10.1 mg/dL (ref 8.7–10.2)
Chloride: 102 mmol/L (ref 96–106)
Creatinine, Ser: 0.89 mg/dL (ref 0.57–1.00)
Glucose: 86 mg/dL (ref 65–99)
Potassium: 4.8 mmol/L (ref 3.5–5.2)
Sodium: 141 mmol/L (ref 134–144)
eGFR: 76 mL/min/{1.73_m2} (ref >59)

## 2021-01-20 ENCOUNTER — Ambulatory Visit: Payer: BC Managed Care – PPO | Admitting: Family Medicine

## 2021-01-20 ENCOUNTER — Other Ambulatory Visit: Payer: Self-pay

## 2021-01-20 ENCOUNTER — Encounter: Payer: Self-pay | Admitting: Family Medicine

## 2021-01-20 VITALS — BP 128/80 | HR 91 | Temp 97.9°F | Ht 66.0 in | Wt 179.4 lb

## 2021-01-20 DIAGNOSIS — F419 Anxiety disorder, unspecified: Secondary | ICD-10-CM | POA: Diagnosis not present

## 2021-01-20 DIAGNOSIS — G4700 Insomnia, unspecified: Secondary | ICD-10-CM

## 2021-01-20 DIAGNOSIS — F411 Generalized anxiety disorder: Secondary | ICD-10-CM

## 2021-01-20 DIAGNOSIS — I10 Essential (primary) hypertension: Secondary | ICD-10-CM | POA: Diagnosis not present

## 2021-01-20 DIAGNOSIS — E785 Hyperlipidemia, unspecified: Secondary | ICD-10-CM

## 2021-01-20 MED ORDER — AMLODIPINE BESYLATE 5 MG PO TABS
1.0000 | ORAL_TABLET | Freq: Every day | ORAL | 1 refills | Status: DC
Start: 2021-01-20 — End: 2021-03-17

## 2021-01-20 MED ORDER — SERTRALINE HCL 100 MG PO TABS
2.0000 | ORAL_TABLET | Freq: Every day | ORAL | 1 refills | Status: DC
Start: 2021-01-20 — End: 2021-03-17

## 2021-01-20 MED ORDER — TRAZODONE HCL 50 MG PO TABS: 50.0000 mg | ORAL_TABLET | Freq: Every evening | ORAL | 1 refills | Status: DC | PRN

## 2021-01-20 MED ORDER — DEXLANSOPRAZOLE 60 MG PO CPDR
1.0000 | DELAYED_RELEASE_CAPSULE | Freq: Every day | ORAL | 1 refills | Status: DC
Start: 2021-01-20 — End: 2021-08-09

## 2021-01-20 MED ORDER — ALPRAZOLAM 1 MG PO TABS
1.0000 mg | ORAL_TABLET | Freq: Two times a day (BID) | ORAL | 5 refills | Status: DC | PRN
Start: 2021-01-20 — End: 2021-07-23

## 2021-01-20 NOTE — Progress Notes (Signed)
Subjective:    Patient ID: Robin Dixon, female    DOB: 17-Mar-1966, 55 y.o.   MRN: [id number]  Hypertension This is a chronic problem. The current episode started more than 1 year ago. Risk factors for coronary artery disease include post-menopausal state. Treatments tried: norvasc.  Cholesterol not under good control Important for the patient to consider cholesterol medicine if she cannot improve it Recent analysis shows that her risk is not above 7% She has plenty of the good cholesterol Blood pressure decent control watch his diet tries to stay active Has stress anxiety Zoloft helps but using alprazolam twice a day we did counsel her to try to gradually cut back on this for health reasons Uses trazodone at nighttime to help sleep Discuss recent labwork Results for orders placed or performed in visit on 12/21/20  Lipid panel  Result Value Ref Range   Cholesterol, Total 306 (H) 100 - 199 mg/dL   Triglycerides 170 (H) 0 - 149 mg/dL   HDL 79 >39 mg/dL   VLDL Cholesterol Cal 31 5 - 40 mg/dL   LDL Chol Calc (NIH) 196 (H) 0 - 99 mg/dL   Lipid Comment: Comment    Chol/HDL Ratio 3.9 0.0 - 4.4 ratio  Hepatic function panel  Result Value Ref Range   Total Protein 7.3 6.0 - 8.5 g/dL   Albumin 4.9 3.8 - 4.9 g/dL   Bilirubin Total 0.4 0.0 - 1.2 mg/dL   Bilirubin, Direct 0.11 0.00 - 0.40 mg/dL   Alkaline Phosphatase 71 44 - 121 IU/L   AST 24 0 - 40 IU/L   ALT 20 0 - 32 IU/L  Basic metabolic panel  Result Value Ref Range   Glucose 86 65 - 99 mg/dL   BUN 18 6 - 24 mg/dL   Creatinine, Ser 0.89 0.57 - 1.00 mg/dL   eGFR 76 >59 mL/min/1.73   BUN/Creatinine Ratio 20 9 - 23   Sodium 141 134 - 144 mmol/L   Potassium 4.8 3.5 - 5.2 mmol/L   Chloride 102 96 - 106 mmol/L   CO2 26 20 - 29 mmol/L   Calcium 10.1 8.7 - 10.2 mg/dL     Review of Systems     Objective:   Physical Exam Vitals reviewed.  Constitutional:      General: She is not in acute distress. HENT:     Head:  Normocephalic and atraumatic.  Eyes:     General:        Right eye: No discharge.        Left eye: No discharge.  Neck:     Trachea: No tracheal deviation.  Cardiovascular:     Rate and Rhythm: Normal rate and regular rhythm.     Heart sounds: Normal heart sounds. No murmur heard.   Pulmonary:     Effort: Pulmonary effort is normal. No respiratory distress.     Breath sounds: Normal breath sounds.  Lymphadenopathy:     Cervical: No cervical adenopathy.  Skin:    General: Skin is warm and dry.  Neurological:     Mental Status: She is alert.     Coordination: Coordination normal.  Psychiatric:        Behavior: Behavior normal.           Assessment & Plan:  1. Insomnia, unspecified type Continue trazodone as needed seems to help her well she would like to continue with  2. Primary hypertension Blood pressure good control continue current measures  3. Anxiety We  did discuss trying to cautiously taper back on Xanax to half tablet 3 times daily if she is successful with this over the next 1 to 2 months try cutting down to half tablet twice a day refills given follow-up 6 months  4. GAD (generalized anxiety disorder) Sertraline does help she would like to continue this  5. Hyperlipidemia, unspecified hyperlipidemia type LDL very elevated very important to get this under control but does not meet the criteria just yet for statins although we did discuss if she cannot bring it below 190 she would need to be on a statin she will recheck this again in 6 months  Mild obesity watch diet watch portions stay active try to lose some weight

## 2021-01-28 ENCOUNTER — Encounter: Payer: Self-pay | Admitting: Family Medicine

## 2021-01-28 NOTE — Telephone Encounter (Signed)
Message made in to phone message in patient chart. Guardian contacted, verbalized understanding and ointment sent to pharmacy

## 2021-01-28 NOTE — Telephone Encounter (Signed)
Nurses  It is hard to diagnose this via a picture.  It could be impetigo.  Bactroban ointment apply small amount twice daily as needed, 22 g tube, may use for 5 to 10 days that should clear this  Please send in medication   If significant worsening will need to consider possibility of cold sore as well may need follow-up office visit if progressive worsening thank you

## 2021-03-17 ENCOUNTER — Other Ambulatory Visit: Payer: Self-pay | Admitting: Family Medicine

## 2021-03-19 ENCOUNTER — Other Ambulatory Visit (HOSPITAL_COMMUNITY): Payer: Self-pay | Admitting: Family Medicine

## 2021-03-19 DIAGNOSIS — Z1231 Encounter for screening mammogram for malignant neoplasm of breast: Secondary | ICD-10-CM

## 2021-03-29 ENCOUNTER — Encounter: Payer: Self-pay | Admitting: Nurse Practitioner

## 2021-04-13 ENCOUNTER — Other Ambulatory Visit: Payer: Self-pay | Admitting: Nurse Practitioner

## 2021-04-13 DIAGNOSIS — F419 Anxiety disorder, unspecified: Secondary | ICD-10-CM

## 2021-04-15 ENCOUNTER — Ambulatory Visit (HOSPITAL_COMMUNITY)
Admission: RE | Admit: 2021-04-15 | Discharge: 2021-04-15 | Disposition: A | Discharge status: Home / Self Care | Payer: BC Managed Care – PPO | Source: Ambulatory Visit | Attending: Family Medicine | Admitting: Family Medicine

## 2021-04-15 ENCOUNTER — Other Ambulatory Visit: Payer: Self-pay

## 2021-04-15 DIAGNOSIS — Z1231 Encounter for screening mammogram for malignant neoplasm of breast: Secondary | ICD-10-CM

## 2021-06-15 ENCOUNTER — Encounter: Payer: Self-pay | Admitting: Family Medicine

## 2021-06-16 ENCOUNTER — Other Ambulatory Visit: Payer: Self-pay | Admitting: Family Medicine

## 2021-06-16 ENCOUNTER — Encounter: Payer: Self-pay | Admitting: Family Medicine

## 2021-06-16 MED ORDER — AMLODIPINE BESYLATE 5 MG PO TABS
5.0000 mg | ORAL_TABLET | Freq: Every day | ORAL | 0 refills | Status: DC
Start: 2021-06-16 — End: 2021-08-09

## 2021-06-16 MED ORDER — SERTRALINE HCL 100 MG PO TABS
200.0000 mg | ORAL_TABLET | Freq: Every day | ORAL | 0 refills | Status: DC
Start: 2021-06-16 — End: 2021-08-09

## 2021-06-16 NOTE — Addendum Note (Signed)
Addended by: Margaretha Sheffield on: 06/16/2021 01:14 PM   Modules accepted: Orders

## 2021-06-16 NOTE — Telephone Encounter (Signed)
May have 3 months on these follow-up by the end of the year thank you

## 2021-06-25 ENCOUNTER — Encounter: Payer: Self-pay | Admitting: Family Medicine

## 2021-06-25 ENCOUNTER — Encounter: Payer: Self-pay | Admitting: Nurse Practitioner

## 2021-07-21 ENCOUNTER — Other Ambulatory Visit: Payer: Self-pay | Admitting: Family Medicine

## 2021-07-22 ENCOUNTER — Encounter: Payer: Self-pay | Admitting: Family Medicine

## 2021-07-23 ENCOUNTER — Other Ambulatory Visit: Payer: Self-pay | Admitting: Family Medicine

## 2021-07-23 MED ORDER — ALPRAZOLAM 1 MG PO TABS
1.0000 mg | ORAL_TABLET | Freq: Two times a day (BID) | ORAL | 0 refills | Status: DC | PRN
Start: 2021-07-23 — End: 2021-08-09

## 2021-08-09 ENCOUNTER — Encounter: Payer: Self-pay | Admitting: Family Medicine

## 2021-08-09 ENCOUNTER — Other Ambulatory Visit: Payer: Self-pay

## 2021-08-09 ENCOUNTER — Ambulatory Visit: Payer: BC Managed Care – PPO | Admitting: Family Medicine

## 2021-08-09 VITALS — BP 132/82 | Ht 66.0 in | Wt 178.0 lb

## 2021-08-09 DIAGNOSIS — I10 Essential (primary) hypertension: Secondary | ICD-10-CM

## 2021-08-09 DIAGNOSIS — E785 Hyperlipidemia, unspecified: Secondary | ICD-10-CM

## 2021-08-09 DIAGNOSIS — F419 Anxiety disorder, unspecified: Secondary | ICD-10-CM | POA: Diagnosis not present

## 2021-08-09 DIAGNOSIS — K219 Gastro-esophageal reflux disease without esophagitis: Secondary | ICD-10-CM

## 2021-08-09 DIAGNOSIS — G4700 Insomnia, unspecified: Secondary | ICD-10-CM

## 2021-08-09 MED ORDER — ALPRAZOLAM 1 MG PO TABS
1.0000 mg | ORAL_TABLET | Freq: Two times a day (BID) | ORAL | 5 refills | Status: DC | PRN
Start: 2021-08-09 — End: 2022-02-11

## 2021-08-09 MED ORDER — DEXLANSOPRAZOLE 60 MG PO CPDR
1.0000 | DELAYED_RELEASE_CAPSULE | Freq: Every day | ORAL | 1 refills | Status: DC
Start: 2021-08-09 — End: 2021-11-17

## 2021-08-09 MED ORDER — TRAZODONE HCL 50 MG PO TABS: 50.0000 mg | ORAL_TABLET | Freq: Every evening | ORAL | 1 refills | Status: DC | PRN

## 2021-08-09 MED ORDER — AMLODIPINE BESYLATE 5 MG PO TABS
5.0000 mg | ORAL_TABLET | Freq: Every day | ORAL | 1 refills | Status: DC
Start: 2021-08-09 — End: 2022-02-11

## 2021-08-09 MED ORDER — SERTRALINE HCL 100 MG PO TABS
200.0000 mg | ORAL_TABLET | Freq: Every day | ORAL | 1 refills | Status: DC
Start: 2021-08-09 — End: 2022-02-11

## 2021-08-09 NOTE — Progress Notes (Signed)
   Subjective:    Patient ID: Robin Dixon, female    DOB: Jan 28, 1966, 55 y.o.   MRN: [id number]  Hypertension This is a chronic problem. The current episode started more than 1 year ago. Risk factors for coronary artery disease include post-menopausal state. There are no compliance problems.    Primary hypertension  Gastroesophageal reflux disease without esophagitis  Anxiety  Insomnia, unspecified type  Hyperlipidemia, unspecified hyperlipidemia type - Plan: Lipid panel    Review of Systems     Objective:   Physical Exam  General-in no acute distress Eyes-no discharge Lungs-respiratory rate normal, CTA CV-no murmurs,RRR Extremities skin warm dry no edema Neuro grossly normal Behavior normal, alert       Assessment & Plan:  1. Primary hypertension Blood pressure good control continue current measures  2. Gastroesophageal reflux disease without esophagitis Takes acid blocker regular basis tries minimize foods that bother her  3. Anxiety Utilizes Xanax 1 mg twice daily I did encourage her to gradually taper down as she stated that she tried to go without the medicine and felt the effects I told her that it would be best for her to try to taper toward half tablet twice daily for the next visit and she could do this over a period of weeks rather than day's Zoloft is helping she would like to continue 4. Insomnia, unspecified type Trazodone at night helps her  5. Hyperlipidemia, unspecified hyperlipidemia type Healthy diet check labs may need to be on statin

## 2021-08-09 NOTE — Progress Notes (Signed)
w

## 2021-08-10 LAB — LIPID PANEL
Chol/HDL Ratio: 3.8 ratio (ref 0.0–4.4)
Cholesterol, Total: 316 mg/dL — ABNORMAL HIGH (ref 100–199)
HDL: 84 mg/dL (ref >39)
LDL Chol Calc (NIH): 206 mg/dL — ABNORMAL HIGH (ref 0–99)
Triglycerides: 144 mg/dL (ref 0–149)
VLDL Cholesterol Cal: 26 mg/dL (ref 5–40)

## 2021-08-11 ENCOUNTER — Other Ambulatory Visit: Payer: Self-pay

## 2021-08-11 DIAGNOSIS — I10 Essential (primary) hypertension: Secondary | ICD-10-CM

## 2021-08-11 DIAGNOSIS — E785 Hyperlipidemia, unspecified: Secondary | ICD-10-CM

## 2021-08-11 MED ORDER — ROSUVASTATIN CALCIUM 20 MG PO TABS: 20.0000 mg | ORAL_TABLET | Freq: Every day | ORAL | 1 refills | Status: DC

## 2021-08-26 ENCOUNTER — Other Ambulatory Visit: Payer: Self-pay | Admitting: Family Medicine

## 2021-08-26 MED ORDER — OSELTAMIVIR PHOSPHATE 75 MG PO CAPS: 75.0000 mg | ORAL_CAPSULE | Freq: Every day | ORAL | 0 refills | Status: DC

## 2021-08-26 NOTE — Progress Notes (Signed)
Rx sent for tamiflu for prophylaxis. Significant other and child with influenza.  Everlene Other DO

## 2021-09-02 ENCOUNTER — Encounter: Payer: Self-pay | Admitting: Family Medicine

## 2021-09-23 ENCOUNTER — Encounter: Payer: Self-pay | Admitting: Adult Health

## 2021-09-23 ENCOUNTER — Other Ambulatory Visit: Payer: Self-pay

## 2021-09-23 ENCOUNTER — Ambulatory Visit (INDEPENDENT_AMBULATORY_CARE_PROVIDER_SITE_OTHER): Payer: BC Managed Care – PPO | Admitting: Adult Health

## 2021-09-23 ENCOUNTER — Other Ambulatory Visit (HOSPITAL_COMMUNITY)
Admission: RE | Admit: 2021-09-23 | Discharge: 2021-09-23 | Disposition: A | Discharge status: Home / Self Care | Payer: BC Managed Care – PPO | Source: Ambulatory Visit | Attending: Adult Health | Admitting: Adult Health

## 2021-09-23 VITALS — BP 129/86 | HR 84 | Ht 66.0 in | Wt 179.6 lb

## 2021-09-23 DIAGNOSIS — Z1211 Encounter for screening for malignant neoplasm of colon: Secondary | ICD-10-CM

## 2021-09-23 DIAGNOSIS — Z01419 Encounter for gynecological examination (general) (routine) without abnormal findings: Secondary | ICD-10-CM | POA: Insufficient documentation

## 2021-09-23 LAB — HEMOCCULT GUIAC POC 1CARD (OFFICE): Fecal Occult Blood, POC: NEGATIVE

## 2021-09-23 NOTE — Progress Notes (Signed)
Patient ID: Robin Dixon, female   DOB: [birth year], 56 y.o.   MRN: [id number] History of Present Illness: Robin Dixon is a 56 year old white female, married, G0P0, has adopted son, PM in for well woman gyn exam and pap. PCP is Dr Lilyan Punt.   Current Medications, Allergies, Past Medical History, Past Surgical History, Family History and Social History were reviewed in [employer] record.     Review of Systems:  Patient denies any headaches, hearing loss, fatigue, blurred vision, shortness of breath, chest pain, abdominal pain, problems with bowel movements, urination, or intercourse.(Has female partner). No joint pain or mood swings.  No vaginal bleeding   Physical Exam:BP 129/86 (BP Location: Right Arm, Patient Position: Sitting, Cuff Size: Normal)    Pulse 84    Ht 5\' 6"  (1.676 m)    Wt 179 lb 9.6 oz (81.5 kg)    BMI 28.99 kg/m   General:  Well developed, well nourished, no acute distress Skin:  Warm and dry Neck:  Midline trachea, normal thyroid, good ROM, no lymphadenopathy Lungs; Clear to auscultation bilaterally Breast:  No dominant palpable mass, retraction, or nipple discharge Cardiovascular: Regular rate and rhythm Abdomen:  Soft, non tender, no hepatosplenomegaly Pelvic:  External genitalia is normal in appearance, no lesions.  The vagina is pale with loss of moisture and rugae.  Urethra has no lesions or masses. The cervix is smooth, pap with HR HPV genotyping performed.  Uterus is felt to be normal size, shape, and contour.  No adnexal masses or tenderness noted.Bladder is non tender, no masses felt. Rectal: Good sphincter tone, no polyps, or hemorrhoids felt.  Hemoccult negative. Extremities/musculoskeletal:  No swelling or varicosities noted, no clubbing or cyanosis Psych:  No mood changes, alert and cooperative,seems happy AA is 5 Fall risk is low Depression screen [facility] 2/9 09/23/2021 01/20/2021 07/08/2020  Decreased Interest 1 0 1  Down, Depressed,  Hopeless 0 0 1  PHQ - 2 Score 1 0 2  Altered sleeping 0 - 0  Tired, decreased energy 1 - 3  Change in appetite 0 - 1  Feeling bad or failure about yourself  0 - 0  Trouble concentrating 0 - 1  Moving slowly or fidgety/restless 0 - 0  Suicidal thoughts 0 - 0  PHQ-9 Score 2 - 7  Difficult doing work/chores - - -    GAD 7 : Generalized Anxiety Score 09/23/2021 07/08/2020 12/23/2019 11/16/2018  Nervous, Anxious, on Edge 1 1 0 3  Control/stop worrying 0 1 0 3  Worry too much - different things 0 1 1 3   Trouble relaxing 0 1 0 2  Restless 0 0 0 3  Easily annoyed or irritable 1 2 1 3   Afraid - awful might happen 0 0 0 0  Total GAD 7 Score 2 6 2 17   Anxiety Difficulty - - Not difficult at all Somewhat difficult      Upstream - 09/23/21 1126       Pregnancy Intention Screening   Does the patient want to become pregnant in the next year? No    Does the patient's partner want to become pregnant in the next year? No    Would the patient like to discuss contraceptive options today? No      Contraception Wrap Up   Current Method No Method - Other Reason   post-menopausal   End Method No Method - Other Reason    Contraception Counseling Provided No  Examination chaperoned by Clint Bolder RN  Impression and Plan: 1. Encounter for gynecological examination with Papanicolaou smear of cervix Physical with PCP  Pap sent and pap in 3 years if normal Mammogram was negative 04/15/21 Colonoscopy in 2025 Labs with PCP   2. Encounter for screening fecal occult blood testing

## 2021-09-27 LAB — CYTOLOGY - PAP
Comment: NEGATIVE
Diagnosis: NEGATIVE
High risk HPV: NEGATIVE

## 2021-11-16 ENCOUNTER — Encounter: Payer: Self-pay | Admitting: Family Medicine

## 2021-11-17 ENCOUNTER — Other Ambulatory Visit: Payer: Self-pay | Admitting: Family Medicine

## 2021-11-17 MED ORDER — PANTOPRAZOLE SODIUM 40 MG PO TBEC: 40.0000 mg | DELAYED_RELEASE_TABLET | Freq: Every day | ORAL | 1 refills | Status: DC

## 2021-12-31 LAB — LIPID PANEL
Chol/HDL Ratio: 2.4 ratio (ref 0.0–4.4)
Cholesterol, Total: 198 mg/dL (ref 100–199)
HDL: 81 mg/dL (ref >39)
LDL Chol Calc (NIH): 100 mg/dL — ABNORMAL HIGH (ref 0–99)
Triglycerides: 97 mg/dL (ref 0–149)
VLDL Cholesterol Cal: 17 mg/dL (ref 5–40)

## 2021-12-31 LAB — HEPATIC FUNCTION PANEL
ALT: 28 IU/L (ref 0–32)
AST: 32 IU/L (ref 0–40)
Albumin: 4.9 g/dL (ref 3.8–4.9)
Alkaline Phosphatase: 81 IU/L (ref 44–121)
Bilirubin Total: 0.5 mg/dL (ref 0.0–1.2)
Bilirubin, Direct: 0.17 mg/dL (ref 0.00–0.40)
Total Protein: 7.4 g/dL (ref 6.0–8.5)

## 2022-02-07 ENCOUNTER — Ambulatory Visit: Payer: BC Managed Care – PPO | Admitting: Family Medicine

## 2022-02-11 ENCOUNTER — Ambulatory Visit: Payer: BC Managed Care – PPO | Admitting: Family Medicine

## 2022-02-11 VITALS — BP 100/63 | HR 73 | Temp 97.7°F | Ht 66.0 in | Wt 171.0 lb

## 2022-02-11 DIAGNOSIS — I10 Essential (primary) hypertension: Secondary | ICD-10-CM

## 2022-02-11 DIAGNOSIS — F419 Anxiety disorder, unspecified: Secondary | ICD-10-CM

## 2022-02-11 DIAGNOSIS — E785 Hyperlipidemia, unspecified: Secondary | ICD-10-CM | POA: Diagnosis not present

## 2022-02-11 MED ORDER — SERTRALINE HCL 100 MG PO TABS: 200.0000 mg | ORAL_TABLET | Freq: Every day | ORAL | 1 refills | Status: DC

## 2022-02-11 MED ORDER — TRAZODONE HCL 50 MG PO TABS: 50.0000 mg | ORAL_TABLET | Freq: Every evening | ORAL | 1 refills | Status: DC | PRN

## 2022-02-11 MED ORDER — AMLODIPINE BESYLATE 5 MG PO TABS: 5.0000 mg | ORAL_TABLET | Freq: Every day | ORAL | 1 refills | Status: DC

## 2022-02-11 MED ORDER — ROSUVASTATIN CALCIUM 20 MG PO TABS: 20.0000 mg | ORAL_TABLET | Freq: Every day | ORAL | 1 refills | Status: DC

## 2022-02-11 MED ORDER — PANTOPRAZOLE SODIUM 40 MG PO TBEC: 40.0000 mg | DELAYED_RELEASE_TABLET | Freq: Every day | ORAL | 1 refills | Status: DC

## 2022-02-11 MED ORDER — ALPRAZOLAM 1 MG PO TABS: 1.0000 mg | ORAL_TABLET | Freq: Two times a day (BID) | ORAL | 5 refills | Status: DC | PRN

## 2022-02-11 NOTE — Progress Notes (Unsigned)
   Subjective:    Patient ID: Robin Dixon, female    DOB: Feb 25, 1966, 56 y.o.   MRN: [id number]  HPI  6 month follow up  Anxiety  Primary hypertension - Plan: rosuvastatin (CRESTOR) 20 MG tablet  Hyperlipidemia, unspecified hyperlipidemia type - Plan: rosuvastatin (CRESTOR) 20 MG tablet Her anxiety been under good control recently takes her medicine denies any setbacks with sertraline or Xanax Uses trazodone at night to help sleep Has intermittent reflux but does take Protonix on a daily basis is open to trying reduced frequency Does take her cholesterol medicine on a regular basis watches diet Takes her blood pressure medicine regular basis states active eating healthy losing weight Review of Systems     Objective:   Physical Exam  General-in no acute distress Eyes-no discharge Lungs-respiratory rate normal, CTA CV-no murmurs,RRR Extremities skin warm dry no edema Neuro grossly normal Behavior normal, alert Results for orders placed or performed in visit on 09/23/21  POCT occult blood stool  Result Value Ref Range   Fecal Occult Blood, POC Negative Negative   Card #1 Date     Card #2 Fecal Occult Blod, POC     Card #2 Date     Card #3 Fecal Occult Blood, POC     Card #3 Date    Cytology - PAP  Result Value Ref Range   High risk HPV Negative    Adequacy      Satisfactory for evaluation; transformation zone component PRESENT.   Diagnosis      - Negative for intraepithelial lesion or malignancy (NILM)   Comment Normal Reference Range HPV - Negative    Recent lab work including lipid, liver was reviewed with patient much improved      Assessment & Plan:  HTN good control continue current measures watch diet stay active Patient has brought her weight down with healthier eating which is good Has underlying anxiety issues does well on sertraline but also takes alprazolam twice daily has been on this for years we will continue with this Cholesterol continue current  medication watch diet closely Reflux under good control recommend patient to try to taper down the dose of this Follow-up 6 months

## 2022-07-15 ENCOUNTER — Ambulatory Visit (INDEPENDENT_AMBULATORY_CARE_PROVIDER_SITE_OTHER): Payer: BC Managed Care – PPO

## 2022-07-15 DIAGNOSIS — Z23 Encounter for immunization: Secondary | ICD-10-CM

## 2022-08-15 ENCOUNTER — Ambulatory Visit: Payer: BC Managed Care – PPO | Admitting: Family Medicine

## 2022-08-15 VITALS — BP 118/78 | HR 65 | Temp 98.1°F | Ht 66.0 in | Wt 165.0 lb

## 2022-08-15 DIAGNOSIS — R002 Palpitations: Secondary | ICD-10-CM | POA: Diagnosis not present

## 2022-08-15 DIAGNOSIS — E785 Hyperlipidemia, unspecified: Secondary | ICD-10-CM

## 2022-08-15 DIAGNOSIS — E875 Hyperkalemia: Secondary | ICD-10-CM

## 2022-08-15 DIAGNOSIS — R197 Diarrhea, unspecified: Secondary | ICD-10-CM

## 2022-08-15 DIAGNOSIS — I10 Essential (primary) hypertension: Secondary | ICD-10-CM | POA: Diagnosis not present

## 2022-08-15 MED ORDER — SERTRALINE HCL 100 MG PO TABS: 200.0000 mg | ORAL_TABLET | Freq: Every day | ORAL | 1 refills | Status: DC

## 2022-08-15 MED ORDER — ROSUVASTATIN CALCIUM 20 MG PO TABS: 20.0000 mg | ORAL_TABLET | Freq: Every day | ORAL | 1 refills | Status: DC

## 2022-08-15 MED ORDER — PANTOPRAZOLE SODIUM 40 MG PO TBEC: 40.0000 mg | DELAYED_RELEASE_TABLET | Freq: Every day | ORAL | 1 refills | Status: DC

## 2022-08-15 MED ORDER — AMLODIPINE BESYLATE 5 MG PO TABS: 5.0000 mg | ORAL_TABLET | Freq: Every day | ORAL | 1 refills | Status: DC

## 2022-08-15 MED ORDER — TRAZODONE HCL 50 MG PO TABS: 50.0000 mg | ORAL_TABLET | Freq: Every evening | ORAL | 1 refills | Status: DC | PRN

## 2022-08-15 MED ORDER — ALPRAZOLAM 1 MG PO TABS: 1.0000 mg | ORAL_TABLET | Freq: Two times a day (BID) | ORAL | 5 refills | Status: DC | PRN

## 2022-08-15 NOTE — Progress Notes (Signed)
   Subjective:    Patient ID: Robin Dixon, female    DOB: [birth year], 56 y.o.   MRN: [id number]  Hypertension This is a chronic problem. Treatments tried: amlodipine.  Patient with some intermittent loose stools present over the past several months occasionally with mucus in it denies any significant abdominal pain denies bloody stools.  States at times the desire to use the bathroom hits her so fast that she has an accident Has history of blood pressure takes her medicine on a regular basis Patient for blood pressure check up.  The patient does have hypertension.   Patient relates dietary measures try to minimize salt The importance of healthy diet and activity were discussed Patient relates compliance  Patient here for follow-up regarding cholesterol.    Patient relates taking medication on a regular basis Denies problems with medication Importance of dietary measures discussed Regular lab work regarding lipid and liver was checked and if needing additional labs was appropriately ordered  She has a history of stress and anxiety takes her antidepressant as well as alprazolam on a regular basis.  I have encouraged patient to try to taper down on alprazolam if possible  She relates intermittent palpitations feeling like her heart is running fast denies any chest pressure tightness left arm pain.  Denies DOE or PND  Review of Systems     Objective:   Physical Exam  General-in no acute distress Eyes-no discharge Lungs-respiratory rate normal, CTA CV-no murmurs,RRR Extremities skin warm dry no edema Neuro grossly normal Behavior normal, alert       Assessment & Plan:  1. Primary hypertension HTN- patient seen for follow-up regarding HTN.   Diet, medication compliance, appropriate labs and refills were completed.   Importance of keeping blood pressure under good control to lessen the risk of complications discussed Regular follow-up visits discussed  Good control continue  current measures - rosuvastatin (CRESTOR) 20 MG tablet; Take 1 tablet (20 mg total) by mouth daily.  Dispense: 90 tablet; Refill: 1 - Comprehensive metabolic panel - Lipid Panel - CBC with Differential - C-reactive protein - Tissue Transglutaminase, IGA  2. Hyperlipidemia, unspecified hyperlipidemia type Hyperlipidemia-importance of diet, weight control, activity, compliance with medications discussed.   Recent labs reviewed.   Any additional labs or refills ordered.   Importance of keeping under good control discussed. Regular follow-up visits discussed  Good control continue current measures - rosuvastatin (CRESTOR) 20 MG tablet; Take 1 tablet (20 mg total) by mouth daily.  Dispense: 90 tablet; Refill: 1 - Comprehensive metabolic panel - Lipid Panel - CBC with Differential - C-reactive protein - Tissue Transglutaminase, IGA  3. Diarrhea, unspecified type Check lab work share this with gastroenterology she sees him later this week may well need further work-up she is up-to-date on colonoscopy - Comprehensive metabolic panel - Lipid Panel - CBC with Differential - C-reactive protein - Tissue Transglutaminase, IGA  4. Palpitations Intermittent palpitations only last for a few moments more so in the evening time patient has cut back on caffeine's.  No need to do telemetry monitoring currently.  If it becomes more progressive or more frequent to let us know right away then we would do cardiology work-up including telemetry and cardio consult - Comprehensive metabolic panel - Lipid Panel - CBC with Differential - C-reactive protein - Tissue Transglutaminase, IGA

## 2022-08-16 LAB — CBC WITH DIFFERENTIAL/PLATELET
Basophils Absolute: 0 10*3/uL (ref 0.0–0.2)
Basos: 1 %
EOS (ABSOLUTE): 0.1 10*3/uL (ref 0.0–0.4)
Eos: 2 %
Hematocrit: 40.9 % (ref 34.0–46.6)
Hemoglobin: 13.9 g/dL (ref 11.1–15.9)
Immature Grans (Abs): 0 10*3/uL (ref 0.0–0.1)
Immature Granulocytes: 0 %
Lymphocytes Absolute: 1.8 10*3/uL (ref 0.7–3.1)
Lymphs: 29 %
MCH: 31.7 pg (ref 26.6–33.0)
MCHC: 34 g/dL (ref 31.5–35.7)
MCV: 93 fL (ref 79–97)
Monocytes Absolute: 0.5 10*3/uL (ref 0.1–0.9)
Monocytes: 8 %
Neutrophils Absolute: 3.8 10*3/uL (ref 1.4–7.0)
Neutrophils: 60 %
Platelets: 240 10*3/uL (ref 150–450)
RBC: 4.39 x10E6/uL (ref 3.77–5.28)
RDW: 12.3 % (ref 11.7–15.4)
WBC: 6.3 10*3/uL (ref 3.4–10.8)

## 2022-08-16 LAB — LIPID PANEL
Chol/HDL Ratio: 2.6 ratio (ref 0.0–4.4)
Cholesterol, Total: 226 mg/dL — ABNORMAL HIGH (ref 100–199)
HDL: 88 mg/dL (ref >39)
LDL Chol Calc (NIH): 113 mg/dL — ABNORMAL HIGH (ref 0–99)
Triglycerides: 148 mg/dL (ref 0–149)
VLDL Cholesterol Cal: 25 mg/dL (ref 5–40)

## 2022-08-16 LAB — COMPREHENSIVE METABOLIC PANEL
ALT: 20 IU/L (ref 0–32)
AST: 25 IU/L (ref 0–40)
Albumin/Globulin Ratio: 2 (ref 1.2–2.2)
Albumin: 5.3 g/dL — ABNORMAL HIGH (ref 3.8–4.9)
Alkaline Phosphatase: 67 IU/L (ref 44–121)
BUN/Creatinine Ratio: 13 (ref 9–23)
BUN: 12 mg/dL (ref 6–24)
Bilirubin Total: 0.4 mg/dL (ref 0.0–1.2)
CO2: 25 mmol/L (ref 20–29)
Calcium: 10.5 mg/dL — ABNORMAL HIGH (ref 8.7–10.2)
Chloride: 103 mmol/L (ref 96–106)
Creatinine, Ser: 0.93 mg/dL (ref 0.57–1.00)
Globulin, Total: 2.6 g/dL (ref 1.5–4.5)
Glucose: 91 mg/dL (ref 70–99)
Potassium: 5.3 mmol/L — ABNORMAL HIGH (ref 3.5–5.2)
Sodium: 142 mmol/L (ref 134–144)
Total Protein: 7.9 g/dL (ref 6.0–8.5)
eGFR: 71 mL/min/{1.73_m2} (ref >59)

## 2022-08-16 LAB — TISSUE TRANSGLUTAMINASE, IGA: Transglutaminase IgA: <2 U/mL (ref 0–3)

## 2022-08-16 LAB — C-REACTIVE PROTEIN: CRP: <1 mg/L (ref 0–10)

## 2022-08-18 MED ORDER — ROSUVASTATIN CALCIUM 40 MG PO TABS: 40.0000 mg | ORAL_TABLET | Freq: Every day | ORAL | 1 refills | Status: DC

## 2022-08-18 NOTE — Addendum Note (Signed)
Addended by: Margaretha Sheffield on: 08/18/2022 11:38 AM   Modules accepted: Orders

## 2022-08-18 NOTE — Addendum Note (Signed)
Addended by: Margaretha Sheffield on: 08/18/2022 09:38 AM   Modules accepted: Orders

## 2022-08-19 ENCOUNTER — Encounter: Payer: Self-pay | Admitting: Internal Medicine

## 2022-08-19 ENCOUNTER — Ambulatory Visit: Payer: BC Managed Care – PPO | Admitting: Internal Medicine

## 2022-08-19 VITALS — BP 134/74 | HR 72 | Temp 97.3°F | Ht 65.5 in | Wt 165.8 lb

## 2022-08-19 DIAGNOSIS — K589 Irritable bowel syndrome without diarrhea: Secondary | ICD-10-CM | POA: Diagnosis not present

## 2022-08-19 DIAGNOSIS — K219 Gastro-esophageal reflux disease without esophagitis: Secondary | ICD-10-CM

## 2022-08-19 MED ORDER — CHOLESTYRAMINE 4 G PO PACK: 2.0000 g | PACK | Freq: Every day | ORAL | 3 refills | Status: DC

## 2022-08-19 MED ORDER — HYOSCYAMINE SULFATE 0.125 MG SL SUBL: 0.1250 mg | SUBLINGUAL_TABLET | SUBLINGUAL | 3 refills | Status: DC | PRN

## 2022-08-19 NOTE — Progress Notes (Unsigned)
Primary Care Physician:  [name], MD Primary Gastroenterologist:  Dr. Jena Gauss  Pre-Procedure History & Physical: HPI:  Robin Dixon is a 56 y.o. female here for insidiously worsening bouts of diarrhea and incontinence over the past few years.  Gallbladder out several years ago.  Diarrhea is intermittent and she may have normal bowel function 1 day and have explosive or urges another day.  Has a fear of going out and about for having a sudden episode of diarrhea.  If she takes an antidiarrheal agent like Imodium she might not go to the bathroom for day.  She is not passing any blood.  No real nausea or vomiting.  GERD well-controlled on Protonix 40 mg daily.  Denies dysphagia.  Colonoscopy in 2015 significant for diverticulosis; 10-year screening schedule commended.  Screening labs through Dr. Fletcher Anon office recently revealed a normal CBC, CRP and a negative TTG IgA.  Past Medical History:  Diagnosis Date   Anxiety 03/29/2013   Carpal tunnel syndrome 07/02/2015   Right   History of neck injury    Hot flashes 03/29/2013   Hyperlipidemia 12/23/2019   Hypertension    Mental disorder    anxiety depression   PMB (postmenopausal bleeding) 06/12/2013   US WNL    Past Surgical History:  Procedure Laterality Date   CARPAL TUNNEL RELEASE Right 09/23/2015   Procedure: RIGHT CARPAL TUNNEL RELEASE;  Surgeon: Vickki Hearing, MD;  Location: [facility];  Service: Orthopedics;  Laterality: Right;   CHOLECYSTECTOMY     [place]   COLONOSCOPY N/A 12/27/2013   Procedure: COLONOSCOPY;  Surgeon: West Bali, MD;  Location: [facility];  Service: Endoscopy;  Laterality: N/A;  1:45   ESOPHAGOGASTRODUODENOSCOPY N/A 12/27/2013   Procedure: ESOPHAGOGASTRODUODENOSCOPY (EGD);  Surgeon: West Bali, MD;  Location: [facility];  Service: Endoscopy;  Laterality: N/A;    Prior to Admission medications   Medication Sig Start Date End Date Taking? Authorizing Provider  ALPRAZolam  Prudy Feeler) 1 MG tablet Take 1 tablet (1 mg total) by mouth 2 (two) times daily as needed. 08/15/22  Yes Luking, Jonna Coup, MD  amLODipine (NORVASC) 5 MG tablet Take 1 tablet (5 mg total) by mouth daily. 08/15/22  Yes Luking, Jonna Coup, MD  pantoprazole (PROTONIX) 40 MG tablet Take 1 tablet (40 mg total) by mouth daily. 08/15/22  Yes [name], MD  rosuvastatin (CRESTOR) 40 MG tablet Take 1 tablet (40 mg total) by mouth daily. 08/18/22  Yes [name], MD  sertraline (ZOLOFT) 100 MG tablet Take 2 tablets (200 mg total) by mouth daily. 08/15/22  Yes [name], MD  traZODone (DESYREL) 50 MG tablet Take 1 tablet (50 mg total) by mouth at bedtime as needed for sleep. 08/15/22  Yes [name], MD    Allergies as of 08/19/2022 - Review Complete 08/19/2022  Allergen Reaction Noted   Keflex [cephalexin] Nausea And Vomiting 11/12/2015    Family History  Problem Relation Age of Onset   Diabetes Mother    Hypertension Mother    Stroke Mother    Cancer Mother        blood   Kidney Stones Mother    Cancer Brother        melanoma   COPD Sister    Hypertension Sister    Hypertension Sister    Colon cancer Neg Hx    Colon polyps Neg Hx     Social History   Socioeconomic History   Marital  status: Married    Spouse name: Not on file   Number of children: Not on file   Years of education: Not on file   Highest education level: Not on file  Occupational History   Not on file  Tobacco Use   Smoking status: Former    Years: 15.00    Types: Cigarettes    Quit date: 02/22/1998    Years since quitting: 24.5   Smokeless tobacco: Never   Tobacco comments:    quit years ago  Vaping Use   Vaping Use: Never used  Substance and Sexual Activity   Alcohol use: Yes    Comment: occ   Drug use: No   Sexual activity: Yes    Birth control/protection: Post-menopausal  Other Topics Concern   Not on file  Social History Narrative   HOBBIES: COACH BASKETBALL, [place], [place],  WORKS PUZZLES. CARES FOR CHICKENS, GUINEAS, A GOATS, A PIGS, A RABBITS.   MARRIED SINCE 2015. OCCASIONAL ETOH.      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   Social Determinants of Health   Financial Resource Strain: Low Risk  (09/23/2021)   Overall Financial Resource Strain (CARDIA)    Difficulty of Paying Living Expenses: Not hard at all  Food Insecurity: No Food Insecurity (09/23/2021)   Hunger Vital Sign    Worried About Running Out of Food in the Last Year: Never true    Ran Out of Food in the Last Year: Never true  Transportation Needs: No Transportation Needs (09/23/2021)   PRAPARE - Hydrologist (Medical): No    Lack of Transportation (Non-Medical): No  Physical Activity: Insufficiently Active (09/23/2021)   Exercise Vital Sign    Days of Exercise per Week: 3 days    Minutes of Exercise per Session: 40 min  Stress: No Stress Concern Present (09/23/2021)   [place]    Feeling of Stress : Not at all  Social Connections: Moderately Integrated (09/23/2021)   Social Connection and Isolation Panel [NHANES]    Frequency of Communication with Friends and Family: Never    Frequency of Social Gatherings with Friends and Family: Three times a week    Attends Religious Services: More than 4 times per year    Active Member of Clubs or Organizations: No    Attends Archivist Meetings: Never    Marital Status: Married  Human resources officer Violence: Not At Risk (09/23/2021)   Humiliation, Afraid, Rape, and Kick questionnaire    Fear of Current or Ex-Partner: No    Emotionally Abused: No    Physically Abused: No    Sexually Abused: No    Review of Systems: See HPI, otherwise negative ROS  Physical Exam: BP 134/74 (BP Location: Right Arm, Patient Position: Sitting, Cuff Size: Normal)   Pulse 72   Temp (!) 97.3 F (36.3 C) (Oral)    Ht 5' 5.5" (1.664 m)   Wt 165 lb 12.8 oz (75.2 kg)   SpO2 97%   BMI 27.17 kg/m  General:   Alert,  Well-developed, well-nourished, pleasant and cooperative in NAD SNeck:  Supple; no masses or thyromegaly. No significant cervical adenopathy. Lungs:  Clear throughout to auscultation.   No wheezes, crackles, or rhonchi. No acute distress. Heart:  Regular rate and rhythm; no murmurs, clicks, rubs,  or gallops. Abdomen: Non-distended, normal bowel sounds.  Soft and nontender without  appreciable mass or hepatosplenomegaly.  Pulses:  Normal pulses noted. Extremities:  Without clubbing or edema.  Impression/Plan: Very pleasant 56 year old lady with chronic symptoms of intermittent nonbloody diarrhea.  Sudden episodes.  Occasional incontinence.  Symptoms interspersed with periods of normal bowel function and some constipation.  Symptoms have been been insidiously progressive over the past couple of years.  Her gallbladder is gone.  Colonoscopy findings as outlined previously.  Recent labs as chronicled above.  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   Do not feel that further diagnostic studies are warranted at this time.  We will focus on a treatment regimen for better symptom control.   GERD well-controlled on Protonix 40 mg daily.  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  Dispense 30 packets with 3 refills.  It is important to take this medication 2 hours before or after any other medications ingested  In addition, will prescribe Levsin or hyoscyamine sublingually 0.125 mg tablets (dispense 40 with 3 refills) this is an  antispasmodic or antidiarrhea pill you can put under your tongue up to 4 times a day you have an episode.  Keep a stool diary.  Call me in 1 month and let me know how you are doing.  Protonix 40 mg daily  As discussed, I do not see any reason for further testing at this time.  We will keep you on schedule for screening colonoscopy in 2025.  Office visit with [place] in 3 months      Notice: This dictation was prepared with Dragon dictation along with smaller phrase technology. Any transcriptional errors that result from this process are unintentional and may not be corrected upon review.

## 2022-08-19 NOTE — Patient Instructions (Signed)
It was good to meet you today!  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  Dispense 30 packets with 3 refills.  It is important to take this medication 2 hours before or after any other medications ingested  In addition, will prescribe Levsin or hyoscyamine sublingually 0.125 mg tablets (dispense 40 with 3 refills) this is an antispasmodic or antidiarrhea pill you can put under your tongue up to 4 times a day you have an episode.  Keep a stool diary.  Call me in 1 month and let me know how you are doing.  Protonix 40 mg daily  As discussed, I do not see any reason for further testing at this time.  We will keep you on schedule for screening colonoscopy in 2025.  Office visit with [place] in 3 months

## 2022-09-26 ENCOUNTER — Telehealth: Payer: Self-pay

## 2022-09-26 ENCOUNTER — Encounter: Payer: Self-pay | Admitting: Family Medicine

## 2022-09-26 LAB — CALCIUM, IONIZED: Calcium, Ion: 5.3 mg/dL (ref 4.5–5.6)

## 2022-09-26 LAB — VITAMIN D 25 HYDROXY (VIT D DEFICIENCY, FRACTURES): Vit D, 25-Hydroxy: 27.2 ng/mL — ABNORMAL LOW (ref 30.0–100.0)

## 2022-09-26 LAB — BASIC METABOLIC PANEL
BUN/Creatinine Ratio: 19 (ref 9–23)
BUN: 15 mg/dL (ref 6–24)
CO2: 24 mmol/L (ref 20–29)
Calcium: 10 mg/dL (ref 8.7–10.2)
Chloride: 102 mmol/L (ref 96–106)
Creatinine, Ser: 0.79 mg/dL (ref 0.57–1.00)
Glucose: 89 mg/dL (ref 70–99)
Potassium: 4.1 mmol/L (ref 3.5–5.2)
Sodium: 140 mmol/L (ref 134–144)
eGFR: 87 mL/min/{1.73_m2} (ref >59)

## 2022-09-26 LAB — PTH-RELATED PEPTIDE: PTH-related peptide: <2 pmol/L

## 2022-09-26 NOTE — Telephone Encounter (Signed)
Nurses-please see the results of the lab work and make sure to relay these to Robin Dixon thank you

## 2022-09-26 NOTE — Telephone Encounter (Signed)
Pt called with an update. Pt states that she is doing much better since starting cholestyramine. Pt states that she has only had maybe 3 episodes since starting it. Pt is satisfied with the results thus far.

## 2022-09-28 MED ORDER — VITAMIN D (ERGOCALCIFEROL) 1.25 MG (50000 UNIT) PO CAPS: 50000.0000 [IU] | ORAL_CAPSULE | ORAL | 0 refills | Status: DC

## 2022-11-18 ENCOUNTER — Ambulatory Visit: Payer: BC Managed Care – PPO | Admitting: Internal Medicine

## 2022-11-22 ENCOUNTER — Ambulatory Visit: Payer: BC Managed Care – PPO | Admitting: Internal Medicine

## 2022-11-22 ENCOUNTER — Encounter: Payer: Self-pay | Admitting: Internal Medicine

## 2022-11-22 VITALS — BP 112/74 | HR 70 | Temp 97.9°F | Ht 65.5 in | Wt 172.2 lb

## 2022-11-22 DIAGNOSIS — R197 Diarrhea, unspecified: Secondary | ICD-10-CM | POA: Diagnosis not present

## 2022-11-22 DIAGNOSIS — K219 Gastro-esophageal reflux disease without esophagitis: Secondary | ICD-10-CM

## 2022-11-22 NOTE — Patient Instructions (Signed)
Happy birthday!  As discussed we need to cut down on cholestyramine /Questran.  Divide a given packet up into thirds and take one third of a packet daily and see if that brings your bowel function close to normal.  If you are still constipated, try dividing the packet up into quarters and take one quarter daily.  Remember not to take within 2 hours before after any other medications.  May take pantoprazole or Protonix 40 mg 30 minutes before lunch daily  May continue using Levsin as needed  Office visit in 6 months  Plan for a colonoscopy in 2025.

## 2022-11-22 NOTE — Progress Notes (Signed)
Primary Care Physician:  Kathyrn Drown, MD Primary Gastroenterologist:  Dr. Gala Romney  Pre-Procedure History & Physical: HPI:  Robin Dixon is a 57 y.o. female here for follow-up of diarrhea.  2 g of cholestyramine daily for abolished diarrhea.  In fact, she is constipated may go 3 days without a bowel movement passes hard balls from time to time. No nocturnal breakthrough reflux symptoms much less than 3 a week on Protonix 40 mg daily does not take before meal.  Due for colonoscopy 2025. Uses hyoscyamine on occasion particularly when she is driving and cannot access the bathroom.  But this is a rare relation. Past Medical History:  Diagnosis Date   Anxiety 03/29/2013   Carpal tunnel syndrome 07/02/2015   Right   History of neck injury    Hot flashes 03/29/2013   Hyperlipidemia 12/23/2019   Hypertension    Mental disorder    anxiety depression   PMB (postmenopausal bleeding) 06/12/2013   US WNL    Past Surgical History:  Procedure Laterality Date   CARPAL TUNNEL RELEASE Right 09/23/2015   Procedure: RIGHT CARPAL TUNNEL RELEASE;  Surgeon: Carole Civil, MD;  Location: [facility];  Service: Orthopedics;  Laterality: Right;   CHOLECYSTECTOMY     [place]   COLONOSCOPY N/A 12/27/2013   Procedure: COLONOSCOPY;  Surgeon: Danie Binder, MD;  Location: [facility];  Service: Endoscopy;  Laterality: N/A;  1:45   ESOPHAGOGASTRODUODENOSCOPY N/A 12/27/2013   Procedure: ESOPHAGOGASTRODUODENOSCOPY (EGD);  Surgeon: Danie Binder, MD;  Location: [facility];  Service: Endoscopy;  Laterality: N/A;    Prior to Admission medications   Medication Sig Start Date End Date Taking? Authorizing Provider  ALPRAZolam Duanne Moron) 1 MG tablet Take 1 tablet (1 mg total) by mouth 2 (two) times daily as needed. 08/15/22  Yes Luking, Elayne Snare, MD  amLODipine (NORVASC) 5 MG tablet Take 1 tablet (5 mg total) by mouth daily. 08/15/22  Yes Kathyrn Drown, MD  cholestyramine (QUESTRAN) 4 g packet Take 0.5  packets (2 g total) by mouth daily. 08/19/22  Yes Mckinzy Fuller, Cristopher Estimable, MD  hyoscyamine (LEVSIN SL) 0.125 MG SL tablet Place 1 tablet (0.125 mg total) under the tongue every 4 (four) hours as needed. 08/19/22  Yes Iolani Twilley, Cristopher Estimable, MD  pantoprazole (PROTONIX) 40 MG tablet Take 1 tablet (40 mg total) by mouth daily. 08/15/22  Yes Kathyrn Drown, MD  rosuvastatin (CRESTOR) 40 MG tablet Take 1 tablet (40 mg total) by mouth daily. 08/18/22  Yes Kathyrn Drown, MD  sertraline (ZOLOFT) 100 MG tablet Take 2 tablets (200 mg total) by mouth daily. 08/15/22  Yes Kathyrn Drown, MD  traZODone (DESYREL) 50 MG tablet Take 1 tablet (50 mg total) by mouth at bedtime as needed for sleep. 08/15/22  Yes Luking, Elayne Snare, MD  Vitamin D, Ergocalciferol, (DRISDOL) 1.25 MG (50000 UNIT) CAPS capsule Take 1 capsule (50,000 Units total) by mouth every 7 (seven) days. 09/28/22  Yes Kathyrn Drown, MD    Allergies as of 11/22/2022 - Review Complete 11/22/2022  Allergen Reaction Noted   Keflex [cephalexin] Nausea And Vomiting 11/12/2015    Family History  Problem Relation Age of Onset   Diabetes Mother    Hypertension Mother    Stroke Mother    Cancer Mother        blood   Kidney Stones Mother    Cancer Brother        melanoma   COPD Sister  Hypertension Sister    Hypertension Sister    Colon cancer Neg Hx    Colon polyps Neg Hx     Social History   Socioeconomic History   Marital status: Married    Spouse name: Not on file   Number of children: Not on file   Years of education: Not on file   Highest education level: Not on file  Occupational History   Not on file  Tobacco Use   Smoking status: Former    Years: 15.00    Types: Cigarettes    Quit date: 02/22/1998    Years since quitting: 24.7   Smokeless tobacco: Never   Tobacco comments:    quit years ago  Vaping Use   Vaping Use: Never used  Substance and Sexual Activity   Alcohol use: Yes    Comment: occ   Drug use: No   Sexual activity: Yes     Birth control/protection: Post-menopausal  Other Topics Concern   Not on file  Social History Narrative   HOBBIES: COACH BASKETBALL, SOCCER, Lillie, WORKS PUZZLES. CARES FOR CHICKENS, GUINEAS, A GOATS, A PIGS, A RABBITS.   MARRIED SINCE 2015. OCCASIONAL ETOH.      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   Social Determinants of Health   Financial Resource Strain: Low Risk  (09/23/2021)   Overall Financial Resource Strain (CARDIA)    Difficulty of Paying Living Expenses: Not hard at all  Food Insecurity: No Food Insecurity (09/23/2021)   Hunger Vital Sign    Worried About Running Out of Food in the Last Year: Never true    Ran Out of Food in the Last Year: Never true  Transportation Needs: No Transportation Needs (09/23/2021)   PRAPARE - Hydrologist (Medical): No    Lack of Transportation (Non-Medical): No  Physical Activity: Insufficiently Active (09/23/2021)   Exercise Vital Sign    Days of Exercise per Week: 3 days    Minutes of Exercise per Session: 40 min  Stress: No Stress Concern Present (09/23/2021)   [place]    Feeling of Stress : Not at all  Social Connections: Moderately Integrated (09/23/2021)   Social Connection and Isolation Panel [NHANES]    Frequency of Communication with Friends and Family: Never    Frequency of Social Gatherings with Friends and Family: Three times a week    Attends Religious Services: More than 4 times per year    Active Member of Clubs or Organizations: No    Attends Archivist Meetings: Never    Marital Status: Married  Human resources officer Violence: Not At Risk (09/23/2021)   Humiliation, Afraid, Rape, and Kick questionnaire    Fear of Current or Ex-Partner: No    Emotionally Abused: No    Physically Abused: No    Sexually Abused: No    Review of Systems: See HPI,  otherwise negative ROS  Physical Exam: BP 112/74 (BP Location: Left Arm, Patient Position: Sitting, Cuff Size: Large)   Pulse 70   Temp 97.9 F (36.6 C) (Oral)   Ht 5' 5.5" (1.664 m)   Wt 172 lb 3.2 oz (78.1 kg)   SpO2 94%   BMI 28.22 kg/m  General:   Alert,  Well-developed, well-nourished, pleasant and cooperative in NAD  Impression/Plan: 57 year old lady with element of bile salt diarrhea.  Cholestyramine is work but we have  overshot her endpoint even with low-dose therapy.  We do have some maneuvering room.  Dosing needs to be customized.  GERD well-controlled with some breakthrough symptoms.  Due for colonoscopy 2025.  Recommendations:  As discussed we need to cut down on cholestyramine /Questran.  Divide a given packet up into thirds and take one third of a packet daily and see if that brings bowel function close to normal.  If still constipated, try dividing the packet up into quarters and take one quarter daily.  Remember not to take within 2 hours before after any other medications.  May take pantoprazole or Protonix 40 mg 30 minutes before lunch daily  May continue using Levsin as needed  Office visit in 6 months  Plan for a colonoscopy in 2025.   Notice: This dictation was prepared with Dragon dictation along with smaller phrase technology. Any transcriptional errors that result from this process are unintentional and may not be corrected upon review.

## 2023-02-03 ENCOUNTER — Other Ambulatory Visit (HOSPITAL_COMMUNITY): Payer: Self-pay | Admitting: Family Medicine

## 2023-02-03 DIAGNOSIS — Z1231 Encounter for screening mammogram for malignant neoplasm of breast: Secondary | ICD-10-CM

## 2023-02-05 ENCOUNTER — Other Ambulatory Visit: Payer: Self-pay | Admitting: Family Medicine

## 2023-02-07 ENCOUNTER — Other Ambulatory Visit: Payer: Self-pay | Admitting: Family Medicine

## 2023-02-09 ENCOUNTER — Other Ambulatory Visit: Payer: Self-pay | Admitting: Family Medicine

## 2023-02-16 ENCOUNTER — Ambulatory Visit (HOSPITAL_COMMUNITY)
Admission: RE | Admit: 2023-02-16 | Discharge: 2023-02-16 | Disposition: A | Discharge status: Home / Self Care | Payer: BC Managed Care – PPO | Source: Ambulatory Visit | Attending: Family Medicine | Admitting: Family Medicine

## 2023-02-16 DIAGNOSIS — Z1231 Encounter for screening mammogram for malignant neoplasm of breast: Secondary | ICD-10-CM | POA: Diagnosis not present

## 2023-02-17 ENCOUNTER — Encounter: Payer: Self-pay | Admitting: Internal Medicine

## 2023-02-17 ENCOUNTER — Other Ambulatory Visit (HOSPITAL_COMMUNITY): Payer: Self-pay | Admitting: Family Medicine

## 2023-02-17 DIAGNOSIS — R928 Other abnormal and inconclusive findings on diagnostic imaging of breast: Secondary | ICD-10-CM

## 2023-02-21 ENCOUNTER — Ambulatory Visit (HOSPITAL_COMMUNITY)
Admission: RE | Admit: 2023-02-21 | Discharge: 2023-02-21 | Disposition: A | Discharge status: Home / Self Care | Payer: BC Managed Care – PPO | Source: Ambulatory Visit | Attending: Family Medicine | Admitting: Family Medicine

## 2023-02-21 DIAGNOSIS — R928 Other abnormal and inconclusive findings on diagnostic imaging of breast: Secondary | ICD-10-CM | POA: Insufficient documentation

## 2023-03-10 ENCOUNTER — Other Ambulatory Visit: Payer: Self-pay | Admitting: Family Medicine

## 2023-03-25 ENCOUNTER — Encounter: Payer: Self-pay | Admitting: Internal Medicine

## 2023-03-27 ENCOUNTER — Other Ambulatory Visit: Payer: Self-pay

## 2023-03-27 MED ORDER — PANTOPRAZOLE SODIUM 40 MG PO TBEC: 40.0000 mg | DELAYED_RELEASE_TABLET | Freq: Two times a day (BID) | ORAL | 3 refills | Status: DC

## 2023-05-05 ENCOUNTER — Other Ambulatory Visit: Payer: Self-pay | Admitting: Family Medicine

## 2023-05-09 ENCOUNTER — Encounter: Payer: Self-pay | Admitting: Family Medicine

## 2023-05-09 ENCOUNTER — Other Ambulatory Visit: Payer: Self-pay | Admitting: Family Medicine

## 2023-05-10 ENCOUNTER — Other Ambulatory Visit: Payer: Self-pay | Admitting: Family Medicine

## 2023-05-13 ENCOUNTER — Encounter: Payer: Self-pay | Admitting: Family Medicine

## 2023-05-17 ENCOUNTER — Encounter: Payer: Self-pay | Admitting: Family Medicine

## 2023-05-17 ENCOUNTER — Ambulatory Visit (INDEPENDENT_AMBULATORY_CARE_PROVIDER_SITE_OTHER): Payer: BC Managed Care – PPO | Admitting: Family Medicine

## 2023-05-17 VITALS — BP 131/79 | HR 67 | Temp 97.9°F | Wt 166.6 lb

## 2023-05-17 DIAGNOSIS — E785 Hyperlipidemia, unspecified: Secondary | ICD-10-CM

## 2023-05-17 DIAGNOSIS — G4700 Insomnia, unspecified: Secondary | ICD-10-CM | POA: Diagnosis not present

## 2023-05-17 DIAGNOSIS — I10 Essential (primary) hypertension: Secondary | ICD-10-CM | POA: Diagnosis not present

## 2023-05-17 DIAGNOSIS — Z79899 Other long term (current) drug therapy: Secondary | ICD-10-CM

## 2023-05-17 DIAGNOSIS — E559 Vitamin D deficiency, unspecified: Secondary | ICD-10-CM | POA: Diagnosis not present

## 2023-05-17 MED ORDER — ALPRAZOLAM 1 MG PO TABS: 1.0000 mg | ORAL_TABLET | Freq: Two times a day (BID) | ORAL | 5 refills | Status: DC

## 2023-05-17 MED ORDER — TRAZODONE HCL 50 MG PO TABS: 50.0000 mg | ORAL_TABLET | Freq: Every evening | ORAL | 1 refills | Status: DC | PRN

## 2023-05-17 MED ORDER — ROSUVASTATIN CALCIUM 40 MG PO TABS: 40.0000 mg | ORAL_TABLET | Freq: Every day | ORAL | 0 refills | Status: DC

## 2023-05-17 MED ORDER — AMLODIPINE BESYLATE 5 MG PO TABS: 5.0000 mg | ORAL_TABLET | Freq: Every day | ORAL | 1 refills | Status: DC

## 2023-05-17 MED ORDER — SERTRALINE HCL 100 MG PO TABS: 200.0000 mg | ORAL_TABLET | Freq: Every day | ORAL | 1 refills | Status: DC

## 2023-05-17 NOTE — Progress Notes (Signed)
   Subjective:    Patient ID: Robin Dixon, female    DOB: 1966-08-02, 57 y.o.   MRN: [id number]  HPI Medication follow up.  Patient here for follow-up Trying to eat healthy stay active Does do some walking Primary hypertension - Plan: Basic Metabolic Panel, Microalbumin/Creatinine Ratio, Urine  Hyperlipidemia, unspecified hyperlipidemia type - Plan: Lipid Panel  Insomnia, unspecified type  Vitamin D deficiency - Plan: Vitamin D, 25-hydroxy  High risk medication use - Plan: Hepatic Function Panel   Review of Systems     Objective:   Physical Exam General-in no acute distress Eyes-no discharge Lungs-respiratory rate normal, CTA CV-no murmurs,RRR Extremities skin warm dry no edema Neuro grossly normal Behavior normal, alert        Assessment & Plan:   1. Primary hypertension Blood pressure check lab work continue medication - Basic Metabolic Panel - Microalbumin/Creatinine Ratio, Urine  2. Hyperlipidemia, unspecified hyperlipidemia type Healthy diet, continue medicine - Lipid Panel  3. Insomnia, unspecified type Uses Xanax at nighttime  4. Vitamin D deficiency Check vitamin D level OTC 2000 units daily - Vitamin D, 25-hydroxy  5. High risk medication use Lab work ordered - Hepatic Function Panel  Sertraline for GAD doing well takes Xanax during the day we did discuss trying to taper the sertraline and if she does not need 200 mg try to go down to 150 or 100 patient will keep [place] posted

## 2023-05-19 ENCOUNTER — Ambulatory Visit: Payer: BC Managed Care – PPO | Admitting: Family Medicine

## 2023-05-22 NOTE — Progress Notes (Unsigned)
GI Office Note    Referring Provider: [name], MD Primary Care Physician:  [name], MD  Primary Gastroenterologist: Roetta Sessions, MD   Chief Complaint   No chief complaint on file.   History of Present Illness   Robin Dixon is a 57 y.o. female presenting today for follow up. Last seen 11/2022. Suspected bile salt diarrhea (responded nicely to cholestyramine) and GERD.       Colonoscopy 12/2013: -normal TI -sigmoid diverticulosis -moderate sized internal hemorrhoids  EGD 12/2013: -small hh -non-erosive gastritis, reactive gastropathy, no h.pylori   Medications   Current Outpatient Medications  Medication Sig Dispense Refill   ALPRAZolam (XANAX) 1 MG tablet Take 1 tablet (1 mg total) by mouth 2 (two) times daily. 60 tablet 5   amLODipine (NORVASC) 5 MG tablet Take 1 tablet (5 mg total) by mouth daily. 90 tablet 1   cholestyramine (QUESTRAN) 4 g packet Take 0.5 packets (2 g total) by mouth daily. 30 each 3   hyoscyamine (LEVSIN SL) 0.125 MG SL tablet Place 1 tablet (0.125 mg total) under the tongue every 4 (four) hours as needed. 40 tablet 3   pantoprazole (PROTONIX) 40 MG tablet Take 1 tablet (40 mg total) by mouth 2 (two) times daily before a meal. 180 tablet 3   rosuvastatin (CRESTOR) 40 MG tablet Take 1 tablet (40 mg total) by mouth daily. 90 tablet 0   sertraline (ZOLOFT) 100 MG tablet Take 2 tablets (200 mg total) by mouth daily. 180 tablet 1   traZODone (DESYREL) 50 MG tablet Take 1 tablet (50 mg total) by mouth at bedtime as needed. for sleep 90 tablet 1   Vitamin D, Ergocalciferol, (DRISDOL) 1.25 MG (50000 UNIT) CAPS capsule Take 1 capsule (50,000 Units total) by mouth every 7 (seven) days. 4 capsule 0   No current facility-administered medications for this visit.    Allergies   Allergies as of 05/23/2023 - Review Complete 05/17/2023  Allergen Reaction Noted   Keflex [cephalexin] Nausea And Vomiting 11/12/2015     Past Medical  History   Past Medical History:  Diagnosis Date   Anxiety 03/29/2013   Carpal tunnel syndrome 07/02/2015   Right   History of neck injury    Hot flashes 03/29/2013   Hyperlipidemia 12/23/2019   Hypertension    Mental disorder    anxiety depression   PMB (postmenopausal bleeding) 06/12/2013   US WNL    Past Surgical History   Past Surgical History:  Procedure Laterality Date   CARPAL TUNNEL RELEASE Right 09/23/2015   Procedure: RIGHT CARPAL TUNNEL RELEASE;  Surgeon: Vickki Hearing, MD;  Location: [facility];  Service: Orthopedics;  Laterality: Right;   CHOLECYSTECTOMY     [place]   COLONOSCOPY N/A 12/27/2013   Procedure: COLONOSCOPY;  Surgeon: West Bali, MD;  Location: [facility];  Service: Endoscopy;  Laterality: N/A;  1:45   ESOPHAGOGASTRODUODENOSCOPY N/A 12/27/2013   Procedure: ESOPHAGOGASTRODUODENOSCOPY (EGD);  Surgeon: West Bali, MD;  Location: [facility];  Service: Endoscopy;  Laterality: N/A;    Past Family History   Family History  Problem Relation Age of Onset   Diabetes Mother    Hypertension Mother    Stroke Mother    Cancer Mother        blood   Kidney Stones Mother    Cancer Brother        melanoma   COPD Sister    Hypertension Sister    Hypertension  Sister    Colon cancer Neg Hx    Colon polyps Neg Hx     Past Social History   Social History   Socioeconomic History   Marital status: Married    Spouse name: Not on file   Number of children: Not on file   Years of education: Not on file   Highest education level: Not on file  Occupational History   Not on file  Tobacco Use   Smoking status: Former    Current packs/day: 0.00    Types: Cigarettes    Start date: 02/23/1983    Quit date: 02/22/1998    Years since quitting: 25.2   Smokeless tobacco: Never   Tobacco comments:    quit years ago  Vaping Use   Vaping status: Never Used  Substance and Sexual Activity   Alcohol use: Yes    Comment: occ   Drug use: No   Sexual  activity: Yes    Birth control/protection: Post-menopausal  Other Topics Concern   Not on file  Social History Narrative   HOBBIES: COACH BASKETBALL, SOCCER, DANCE AND PLAYS, WORKS PUZZLES. CARES FOR CHICKENS, GUINEAS, A GOATS, A PIGS, A RABBITS.   MARRIED SINCE 2015. OCCASIONAL ETOH.      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   Social Determinants of Health   Financial Resource Strain: Low Risk  (09/23/2021)   Overall Financial Resource Strain (CARDIA)    Difficulty of Paying Living Expenses: Not hard at all  Food Insecurity: No Food Insecurity (09/23/2021)   Hunger Vital Sign    Worried About Running Out of Food in the Last Year: Never true    Ran Out of Food in the Last Year: Never true  Transportation Needs: No Transportation Needs (09/23/2021)   PRAPARE - Administrator, Civil Service (Medical): No    Lack of Transportation (Non-Medical): No  Physical Activity: Insufficiently Active (09/23/2021)   Exercise Vital Sign    Days of Exercise per Week: 3 days    Minutes of Exercise per Session: 40 min  Stress: No Stress Concern Present (09/23/2021)   [employer] of Occupational Health - Occupational Stress Questionnaire    Feeling of Stress : Not at all  Social Connections: Moderately Integrated (09/23/2021)   Social Connection and Isolation Panel [NHANES]    Frequency of Communication with Friends and Family: Never    Frequency of Social Gatherings with Friends and Family: Three times a week    Attends Religious Services: More than 4 times per year    Active Member of Clubs or Organizations: No    Attends Banker Meetings: Never    Marital Status: Married  Catering manager Violence: Not At Risk (09/23/2021)   Humiliation, Afraid, Rape, and Kick questionnaire    Fear of Current or Ex-Partner: No    Emotionally Abused: No    Physically Abused: No    Sexually Abused: No    Review of Systems    General: Negative for anorexia, weight loss, fever, chills, fatigue, weakness. ENT: Negative for hoarseness, difficulty swallowing , nasal congestion. CV: Negative for chest pain, angina, palpitations, dyspnea on exertion, peripheral edema.  Respiratory: Negative for dyspnea at rest, dyspnea on exertion, cough, sputum, wheezing.  GI: See history of present illness. GU:  Negative for dysuria, hematuria, urinary incontinence, urinary frequency, nocturnal urination.  Endo: Negative for unusual weight change.     Physical Exam   There  were no vitals taken for this visit.   General: Well-nourished, well-developed in no acute distress.  Eyes: No icterus. Mouth: Oropharyngeal mucosa moist and pink , no lesions erythema or exudate. Lungs: Clear to auscultation bilaterally.  Heart: Regular rate and rhythm, no murmurs rubs or gallops.  Abdomen: Bowel sounds are normal, nontender, nondistended, no hepatosplenomegaly or masses,  no abdominal bruits or hernia , no rebound or guarding.  Rectal: ***  Extremities: No lower extremity edema. No clubbing or deformities. Neuro: Alert and oriented x 4   Skin: Warm and dry, no jaundice.   Psych: Alert and cooperative, normal mood and affect.  Labs   Lab Results  Component Value Date   NA 140 09/21/2022   CL 102 09/21/2022   K 4.1 09/21/2022   CO2 24 09/21/2022   BUN 15 09/21/2022   CREATININE 0.79 09/21/2022   EGFR 87 09/21/2022   CALCIUM 10.0 09/21/2022   ALBUMIN 5.3 (H) 08/15/2022   GLUCOSE 89 09/21/2022   Lab Results  Component Value Date   WBC 6.3 08/15/2022   HGB 13.9 08/15/2022   HCT 40.9 08/15/2022   MCV 93 08/15/2022   PLT 240 08/15/2022   Lab Results  Component Value Date   ALT 20 08/15/2022   AST 25 08/15/2022   ALKPHOS 67 08/15/2022   BILITOT 0.4 08/15/2022    Imaging Studies   No results found.  Assessment       PLAN   ***   Leanna Battles. Melvyn Neth, MHS, PA-C [facility]

## 2023-05-23 ENCOUNTER — Ambulatory Visit: Payer: BC Managed Care – PPO | Admitting: Gastroenterology

## 2023-05-23 ENCOUNTER — Ambulatory Visit: Payer: BC Managed Care – PPO | Admitting: Internal Medicine

## 2023-05-23 ENCOUNTER — Encounter: Payer: Self-pay | Admitting: Gastroenterology

## 2023-05-23 VITALS — BP 120/76 | HR 66 | Temp 97.9°F | Ht 65.5 in | Wt 164.4 lb

## 2023-05-23 DIAGNOSIS — R197 Diarrhea, unspecified: Secondary | ICD-10-CM | POA: Insufficient documentation

## 2023-05-23 DIAGNOSIS — K219 Gastro-esophageal reflux disease without esophagitis: Secondary | ICD-10-CM | POA: Diagnosis not present

## 2023-05-23 MED ORDER — HYOSCYAMINE SULFATE 0.125 MG SL SUBL: 0.1250 mg | SUBLINGUAL_TABLET | SUBLINGUAL | 3 refills | Status: AC | PRN

## 2023-05-23 MED ORDER — CHOLESTYRAMINE 4 G PO PACK: 2.0000 g | PACK | Freq: Every day | ORAL | 6 refills | Status: AC

## 2023-05-23 NOTE — Patient Instructions (Addendum)
Continue Levsin, 1 tablet placed under the tongue every 4 hours as needed for abdominal cramping and loose stools. Continue cholestyramine 1/3 packet daily, separate from other medications by 2 hours. Continue pantoprazole 40 mg once to twice daily before a meal to control reflux.  Use lowest effective dose. We will send you a reminder when your colonoscopy is due in April 2025. Plan to see you back in the office in 1 year.  Call sooner if you have any questions or concerns.   It was a pleasure to see you today.  I truly value your feedback, so please be on the lookout for a survey regarding your visit with me today. I appreciate your time in completing this!

## 2023-06-20 ENCOUNTER — Ambulatory Visit: Payer: BC Managed Care – PPO | Admitting: Family Medicine

## 2023-08-21 ENCOUNTER — Encounter: Payer: Self-pay | Admitting: Family Medicine

## 2023-09-06 ENCOUNTER — Encounter: Payer: Self-pay | Admitting: Family Medicine

## 2023-09-06 NOTE — Telephone Encounter (Signed)
Plz add alpha gal Dx diarrhea

## 2023-09-07 ENCOUNTER — Other Ambulatory Visit: Payer: Self-pay

## 2023-09-07 DIAGNOSIS — R197 Diarrhea, unspecified: Secondary | ICD-10-CM

## 2023-09-18 ENCOUNTER — Encounter: Payer: Self-pay | Admitting: Family Medicine

## 2023-10-08 ENCOUNTER — Encounter: Payer: Self-pay | Admitting: Family Medicine

## 2023-10-08 LAB — BASIC METABOLIC PANEL
BUN/Creatinine Ratio: 17 (ref 9–23)
BUN: 14 mg/dL (ref 6–24)
CO2: 22 mmol/L (ref 20–29)
Calcium: 9.5 mg/dL (ref 8.7–10.2)
Chloride: 102 mmol/L (ref 96–106)
Creatinine, Ser: 0.83 mg/dL (ref 0.57–1.00)
Glucose: 85 mg/dL (ref 70–99)
Potassium: 4.5 mmol/L (ref 3.5–5.2)
Sodium: 140 mmol/L (ref 134–144)
eGFR: 81 mL/min/{1.73_m2} (ref >59)

## 2023-10-08 LAB — HEPATIC FUNCTION PANEL
ALT: 21 [IU]/L (ref 0–32)
AST: 21 [IU]/L (ref 0–40)
Albumin: 4.7 g/dL (ref 3.8–4.9)
Alkaline Phosphatase: 68 [IU]/L (ref 44–121)
Bilirubin Total: 0.4 mg/dL (ref 0.0–1.2)
Bilirubin, Direct: 0.15 mg/dL (ref 0.00–0.40)
Total Protein: 6.9 g/dL (ref 6.0–8.5)

## 2023-10-08 LAB — LIPID PANEL
Chol/HDL Ratio: 1.9 {ratio} (ref 0.0–4.4)
Cholesterol, Total: 171 mg/dL (ref 100–199)
HDL: 91 mg/dL (ref >39)
LDL Chol Calc (NIH): 63 mg/dL (ref 0–99)
Triglycerides: 94 mg/dL (ref 0–149)
VLDL Cholesterol Cal: 17 mg/dL (ref 5–40)

## 2023-10-08 LAB — MICROALBUMIN / CREATININE URINE RATIO
Creatinine, Urine: 66 mg/dL
Microalb/Creat Ratio: 9 mg/g{creat} (ref 0–29)
Microalbumin, Urine: 5.8 ug/mL

## 2023-10-08 LAB — VITAMIN D 25 HYDROXY (VIT D DEFICIENCY, FRACTURES): Vit D, 25-Hydroxy: 32 ng/mL (ref 30.0–100.0)

## 2023-10-09 ENCOUNTER — Encounter: Payer: Self-pay | Admitting: Family Medicine

## 2023-10-09 LAB — ALPHA-GAL PANEL
Allergen Lamb IgE: <0.1 kU/L
Beef IgE: <0.1 kU/L
IgE (Immunoglobulin E), Serum: 73 [IU]/mL (ref 6–495)
O215-IgE Alpha-Gal: <0.1 kU/L
Pork IgE: <0.1 kU/L

## 2023-10-31 ENCOUNTER — Other Ambulatory Visit: Payer: Self-pay | Admitting: Family Medicine

## 2023-10-31 DIAGNOSIS — E785 Hyperlipidemia, unspecified: Secondary | ICD-10-CM

## 2023-11-15 ENCOUNTER — Ambulatory Visit: Payer: 59 | Admitting: Family Medicine

## 2023-11-15 ENCOUNTER — Ambulatory Visit (HOSPITAL_COMMUNITY)
Admission: RE | Admit: 2023-11-15 | Discharge: 2023-11-15 | Disposition: A | Discharge status: Home / Self Care | Payer: 59 | Source: Ambulatory Visit | Attending: Family Medicine | Admitting: Family Medicine

## 2023-11-15 ENCOUNTER — Encounter: Payer: Self-pay | Admitting: Internal Medicine

## 2023-11-15 ENCOUNTER — Encounter: Payer: Self-pay | Admitting: Family Medicine

## 2023-11-15 VITALS — BP 126/74 | HR 66 | Temp 98.0°F | Ht 65.5 in | Wt 170.0 lb

## 2023-11-15 DIAGNOSIS — Z23 Encounter for immunization: Secondary | ICD-10-CM | POA: Diagnosis not present

## 2023-11-15 DIAGNOSIS — M25551 Pain in right hip: Secondary | ICD-10-CM | POA: Insufficient documentation

## 2023-11-15 DIAGNOSIS — E785 Hyperlipidemia, unspecified: Secondary | ICD-10-CM

## 2023-11-15 DIAGNOSIS — M542 Cervicalgia: Secondary | ICD-10-CM | POA: Diagnosis present

## 2023-11-15 MED ORDER — TRAZODONE HCL 50 MG PO TABS: 50.0000 mg | ORAL_TABLET | Freq: Every evening | ORAL | 1 refills | Status: DC | PRN

## 2023-11-15 MED ORDER — SERTRALINE HCL 100 MG PO TABS: 200.0000 mg | ORAL_TABLET | Freq: Every day | ORAL | 1 refills | Status: DC

## 2023-11-15 MED ORDER — AMLODIPINE BESYLATE 5 MG PO TABS: 5.0000 mg | ORAL_TABLET | Freq: Every day | ORAL | 1 refills | Status: DC

## 2023-11-15 MED ORDER — ALPRAZOLAM 1 MG PO TABS: 1.0000 mg | ORAL_TABLET | Freq: Two times a day (BID) | ORAL | 5 refills | Status: DC

## 2023-11-15 MED ORDER — ROSUVASTATIN CALCIUM 40 MG PO TABS: 40.0000 mg | ORAL_TABLET | Freq: Every day | ORAL | 0 refills | Status: DC

## 2023-11-15 NOTE — Progress Notes (Signed)
 Subjective:    Patient ID: Robin Dixon, female    DOB: [birth year], 58 y.o.   MRN: [id number]  Discussed the use of AI scribe software for clinical note transcription with the patient, who gave verbal consent to proceed.  History of Present Illness   Robin Dixon is a 58 year old female who presents with hip and neck pain.  She experiences right hip pain, particularly when sitting for a while and then getting up, describing it as feeling like she 'can't hold a ball.' The pain is located on the right side, more towards the outer part, and sometimes extends to the groin. Walking for about ten minutes exacerbates the pain, requiring her to slow down or rest. The symptoms have been progressively worsening, affecting her sleep as she wakes up with aching pain. She occasionally takes Tylenol or ibuprofen for relief.  She has ongoing neck pain, which has been present for months and is progressively worsening. She applies Aspercreme to her neck before bed, which helps alleviate the pain. She does not take any oral medication for the neck pain. The pain does not radiate to her arms but affects her shoulders, particularly the upper trapezius on both sides. Activities involving reaching with her hands exacerbate the pain. No cracking or popping in the neck and no numbness in her hands while driving, although she experiences whole-hand numbness when lying in bed, which resolves with movement.  She reports loose stools, particularly after consuming more than one cup of coffee in the morning. She uses a powder packet to manage her bowel movements, typically taking half a packet a day, and occasionally takes Imodium to prevent loose stools when she needs to go out.  She is consistent with her medications, including alprazolam, which she takes twice a day. She does not currently take any over-the-counter vitamins.         Review of Systems     Objective:    Physical Exam   CHEST: Lungs clear to  auscultation bilaterally. CARDIOVASCULAR: Heart regular rate and rhythm, no murmurs. MUSCULOSKELETAL: Neck range of motion limited to the left with pain on right side. Right hip pain on internal rotation. Good strength in arms, no evidence of nerve impingement.     General-in no acute distress Eyes-no discharge Lungs-respiratory rate normal, CTA CV-no murmurs,RRR Extremities skin warm dry no edema Neuro grossly normal Behavior normal, alert Patient has good range of motion of the neck with some slight increased pain on the right side with rotation to the left strength in the arms are good Hip has good range of motion with subjective discomfort in the right hip       Assessment & Plan:  Assessment and Plan    Right Hip Pain Pain with movement and after sitting, radiating to the groin. Suspected hip arthritis based on clinical examination. -Order hip X-ray to confirm diagnosis and assess severity. -Consider short course of anti-inflammatory medication depending on X-ray results. -Refer to orthopedics for further management if symptoms persist, including possible injection, physical therapy, or surgery.  Cervical Spine Pain Chronic neck pain with radiation to shoulders, managed with topical Aspercreme. Suspected degenerative spine disease. -Order neck X-ray to assess severity of degenerative changes. -Continue current management with Aspercreme and warm compresses. -Consider further imaging (MRI) if severe pain radiating down the arm or weakness in the arms develops.  General Health Maintenance -Administer influenza vaccine today. -Continue current cholesterol medication due to beneficial effects seen in cholesterol profile. -Continue over-the-counter  Vitamin D supplementation after completion of current prescription. -Consider increasing frequency of anti-diarrheal medication if loose stools persist. -Provide update on Steven's ADD management in a few weeks.      1.  Hyperlipidemia, unspecified hyperlipidemia type Doing well healthy diet continue meds - rosuvastatin (CRESTOR) 40 MG tablet; Take 1 tablet (40 mg total) by mouth daily.  Dispense: 90 tablet; Refill: 0  2. Pain of right hip (Primary) X-rays ordered could be developing arthritis may or may not need to orthopedics depending on the results - DG HIP UNILAT W OR W/O PELVIS 2-3 VIEWS RIGHT  3. Cervical pain Currently using topical Aspercreme-x-rays ordered-do not feel patient needs MRI currently - DG Cervical Spine Complete No sign of muscle weakness with this 4. Immunization due Given today - Flu vaccine trivalent PF, 6mos and older(Flulaval,Afluria,Fluarix,Fluzone)

## 2023-11-23 ENCOUNTER — Encounter: Payer: Self-pay | Admitting: Family Medicine

## 2023-11-24 ENCOUNTER — Encounter: Payer: Self-pay | Admitting: *Deleted

## 2023-12-03 ENCOUNTER — Encounter: Payer: Self-pay | Admitting: Family Medicine

## 2023-12-06 ENCOUNTER — Other Ambulatory Visit: Payer: Self-pay | Admitting: Family Medicine

## 2023-12-06 MED ORDER — DICLOFENAC SODIUM 75 MG PO TBEC: DELAYED_RELEASE_TABLET | ORAL | 1 refills | Status: DC

## 2023-12-07 ENCOUNTER — Telehealth: Payer: Self-pay | Admitting: *Deleted

## 2023-12-07 NOTE — Telephone Encounter (Signed)
  Procedure: COLONOSCOPY  Estimated body mass index is 27.86 kg/m as calculated from the following:   Height as of 11/15/23: 5' 5.5" (1.664 m).   Weight as of 11/15/23: 170 lb (77.1 kg).   Have you had a colonoscopy before?  12/27/13, Dr. Darrick Penna  Do you have family history of colon cancer?  no  Do you have a family history of polyps? no  Previous colonoscopy with polyps removed? no  Do you have a history colorectal cancer?   no  Are you diabetic?  no  Do you have a prosthetic or mechanical heart valve? no  Do you have a pacemaker/defibrillator?   no  Have you had endocarditis/atrial fibrillation?  no  Do you use supplemental oxygen/CPAP?  no  Have you had joint replacement within the last 12 months?  no  Do you tend to be constipated or have to use laxatives?  no   Do you have history of alcohol use? If yes, how much and how often.  1-2 beers socially  Do you have history or are you using drugs? If yes, what do are you  using?  no  Have you ever had a stroke/heart attack?  no  Have you ever had a heart or other vascular stent placed,?no  Do you take weight loss medication? no  female patients,: have you had a hysterectomy? no                              are you post menopausal?  yes                              do you still have your menstrual cycle? no    Date of last menstrual period?   Do you take any blood-thinning medications such as: (Plavix, aspirin, Coumadin, Aggrenox, Brilinta, Xarelto, Eliquis, Pradaxa, Savaysa or Effient)? years   no If yes we need the name, milligram, dosage and who is prescribing doctor:               Current Outpatient Medications  Medication Sig Dispense Refill   ALPRAZolam (XANAX) 1 MG tablet Take 1 tablet (1 mg total) by mouth 2 (two) times daily. 60 tablet 5   amLODipine (NORVASC) 5 MG tablet Take 1 tablet (5 mg total) by mouth daily. 90 tablet 1   cholestyramine (QUESTRAN) 4 g packet Take 0.5 packets (2 g total) by mouth daily.  30 each 6   diclofenac (VOLTAREN) 75 MG EC tablet Twice daily as needed for arthritic pain 30 tablet 1   hyoscyamine (LEVSIN SL) 0.125 MG SL tablet Place 1 tablet (0.125 mg total) under the tongue every 4 (four) hours as needed. 90 tablet 3   pantoprazole (PROTONIX) 40 MG tablet Take 1 tablet (40 mg total) by mouth 2 (two) times daily before a meal. 180 tablet 3   rosuvastatin (CRESTOR) 40 MG tablet Take 1 tablet (40 mg total) by mouth daily. 90 tablet 0   sertraline (ZOLOFT) 100 MG tablet Take 2 tablets (200 mg total) by mouth daily. 180 tablet 1   traZODone (DESYREL) 50 MG tablet Take 1 tablet (50 mg total) by mouth at bedtime as needed. for sleep 90 tablet 1   No current facility-administered medications for this visit.    Allergies  Allergen Reactions   Keflex [Cephalexin] Nausea And Vomiting    vomiting

## 2024-01-19 NOTE — Telephone Encounter (Signed)
 Ok to schedule. ASA 2. Hold cholestyramine  five days before.

## 2024-01-22 MED ORDER — NA SULFATE-K SULFATE-MG SULF 17.5-3.13-1.6 GM/177ML PO SOLN: 1.0000 | ORAL | 0 refills | Status: DC

## 2024-01-22 NOTE — Addendum Note (Signed)
 Addended by: Feliz Hosteller on: 01/22/2024 08:44 AM   Modules accepted: Orders

## 2024-01-22 NOTE — Telephone Encounter (Signed)
 SPOKE WITH PT. Scheduled with Dr. Mordechai April 5/20. Aware will send instructions, rx for prep sent to pharmacy and aware to hold medication x 5 days prior.

## 2024-01-23 NOTE — Telephone Encounter (Signed)
 Questionnaire from recall, no referral needed

## 2024-01-25 ENCOUNTER — Other Ambulatory Visit: Payer: Self-pay

## 2024-01-25 ENCOUNTER — Encounter: Payer: Self-pay | Admitting: Family Medicine

## 2024-01-25 MED ORDER — DICLOFENAC SODIUM 75 MG PO TBEC: DELAYED_RELEASE_TABLET | ORAL | 1 refills | Status: DC

## 2024-02-06 ENCOUNTER — Other Ambulatory Visit: Payer: Self-pay

## 2024-02-06 ENCOUNTER — Ambulatory Visit (HOSPITAL_COMMUNITY)
Admission: RE | Admit: 2024-02-06 | Discharge: 2024-02-06 | Disposition: A | Discharge status: Home / Self Care | Attending: Internal Medicine | Admitting: Internal Medicine

## 2024-02-06 ENCOUNTER — Ambulatory Visit (HOSPITAL_COMMUNITY)

## 2024-02-06 ENCOUNTER — Ambulatory Visit (HOSPITAL_BASED_OUTPATIENT_CLINIC_OR_DEPARTMENT_OTHER)

## 2024-02-06 ENCOUNTER — Encounter (HOSPITAL_COMMUNITY)
Admission: RE | Disposition: A | Discharge status: Home / Self Care | Payer: Self-pay | Source: Home / Self Care | Attending: Internal Medicine

## 2024-02-06 ENCOUNTER — Encounter (HOSPITAL_COMMUNITY): Payer: Self-pay | Admitting: Internal Medicine

## 2024-02-06 DIAGNOSIS — F419 Anxiety disorder, unspecified: Secondary | ICD-10-CM | POA: Diagnosis not present

## 2024-02-06 DIAGNOSIS — Z87891 Personal history of nicotine dependence: Secondary | ICD-10-CM | POA: Diagnosis not present

## 2024-02-06 DIAGNOSIS — Z1211 Encounter for screening for malignant neoplasm of colon: Secondary | ICD-10-CM | POA: Insufficient documentation

## 2024-02-06 DIAGNOSIS — I10 Essential (primary) hypertension: Secondary | ICD-10-CM | POA: Diagnosis not present

## 2024-02-06 DIAGNOSIS — K648 Other hemorrhoids: Secondary | ICD-10-CM | POA: Insufficient documentation

## 2024-02-06 DIAGNOSIS — K219 Gastro-esophageal reflux disease without esophagitis: Secondary | ICD-10-CM | POA: Diagnosis not present

## 2024-02-06 DIAGNOSIS — K5730 Diverticulosis of large intestine without perforation or abscess without bleeding: Secondary | ICD-10-CM | POA: Diagnosis not present

## 2024-02-06 HISTORY — PX: COLONOSCOPY: SHX5424

## 2024-02-06 SURGERY — COLONOSCOPY
Anesthesia: General

## 2024-02-06 MED ORDER — PROPOFOL 10 MG/ML IV BOLUS
INTRAVENOUS | Status: DC | PRN
  Administered 2024-02-06: 100 mg via INTRAVENOUS

## 2024-02-06 MED ORDER — LACTATED RINGERS IV SOLN: INTRAVENOUS | Status: DC | PRN

## 2024-02-06 MED ORDER — LIDOCAINE 2% (20 MG/ML) 5 ML SYRINGE
INTRAMUSCULAR | Status: DC | PRN
  Administered 2024-02-06: 100 mg via INTRAVENOUS

## 2024-02-06 MED ORDER — PROPOFOL 500 MG/50ML IV EMUL
INTRAVENOUS | Status: DC | PRN
Start: 2024-02-06 — End: 2024-02-06
  Administered 2024-02-06: 150 ug/kg/min via INTRAVENOUS

## 2024-02-06 NOTE — Discharge Instructions (Signed)

## 2024-02-06 NOTE — Transfer of Care (Signed)
 Immediate Anesthesia Transfer of Care Note  Patient: Robin Dixon  Procedure(s) Performed: COLONOSCOPY  Patient Location: Endoscopy Unit  Anesthesia Type:General  Level of Consciousness: awake, alert , and oriented  Airway & Oxygen Therapy: Patient Spontanous Breathing  Post-op Assessment: Report given to RN and Post -op Vital signs reviewed and stable  Post vital signs: Reviewed and stable  Last Vitals:  Vitals Value Taken Time  BP    Temp    Pulse    Resp    SpO2      Last Pain:  Vitals:   02/06/24 1311  TempSrc:   PainSc: 0-No pain      Patients Stated Pain Goal: 8 (02/06/24 1219)  Complications: No notable events documented.

## 2024-02-06 NOTE — H&P (Signed)
 Primary Care Physician:  Bennet Brasil, MD Primary Gastroenterologist:  Dr. Mordechai April  Pre-Procedure History & Physical: HPI:  Robin Dixon is a 58 y.o. female is here for a colonoscopy for colon cancer screening purposes.   Past Medical History:  Diagnosis Date   Anxiety 03/29/2013   Carpal tunnel syndrome 07/02/2015   Right   History of neck injury    Hot flashes 03/29/2013   Hyperlipidemia 12/23/2019   Hypertension    Mental disorder    anxiety depression   PMB (postmenopausal bleeding) 06/12/2013   US  WNL    Past Surgical History:  Procedure Laterality Date   CARPAL TUNNEL RELEASE Right 09/23/2015   Procedure: RIGHT CARPAL TUNNEL RELEASE;  Surgeon: Darrin Emerald, MD;  Location: [facility];  Service: Orthopedics;  Laterality: Right;   CHOLECYSTECTOMY     [place]   COLONOSCOPY N/A 12/27/2013   Procedure: COLONOSCOPY;  Surgeon: Alyce Jubilee, MD;  Location: [facility];  Service: Endoscopy;  Laterality: N/A;  1:45   ESOPHAGOGASTRODUODENOSCOPY N/A 12/27/2013   Procedure: ESOPHAGOGASTRODUODENOSCOPY (EGD);  Surgeon: Alyce Jubilee, MD;  Location: [facility];  Service: Endoscopy;  Laterality: N/A;    Prior to Admission medications   Medication Sig Start Date End Date Taking? Authorizing Provider  ALPRAZolam  (XANAX ) 1 MG tablet Take 1 tablet (1 mg total) by mouth 2 (two) times daily. 11/15/23  Yes Bennet Brasil, MD  amLODipine  (NORVASC ) 5 MG tablet Take 1 tablet (5 mg total) by mouth daily. 11/15/23  Yes Bennet Brasil, MD  cholestyramine  (QUESTRAN ) 4 g packet Take 0.5 packets (2 g total) by mouth daily. 05/23/23  Yes Lanney Pitts, PA-C  diclofenac  (VOLTAREN ) 75 MG EC tablet Twice daily as needed for arthritic pain 01/25/24  Yes Luking, Jackelyn Marvel, MD  pantoprazole  (PROTONIX ) 40 MG tablet Take 1 tablet (40 mg total) by mouth 2 (two) times daily before a meal. 03/27/23  Yes Rourk, Windsor Hatcher, MD  rosuvastatin  (CRESTOR ) 40 MG tablet Take 1 tablet (40 mg total) by mouth daily.  11/15/23  Yes Bennet Brasil, MD  sertraline  (ZOLOFT ) 100 MG tablet Take 2 tablets (200 mg total) by mouth daily. 11/15/23  Yes Bennet Brasil, MD  traZODone  (DESYREL ) 50 MG tablet Take 1 tablet (50 mg total) by mouth at bedtime as needed. for sleep 11/15/23  Yes Luking, Jackelyn Marvel, MD  hyoscyamine  (LEVSIN SL) 0.125 MG SL tablet Place 1 tablet (0.125 mg total) under the tongue every 4 (four) hours as needed. 05/23/23   Lanney Pitts, PA-C  Na Sulfate-K Sulfate-Mg Sulfate concentrate (SUPREP) 17.5-3.13-1.6 GM/177ML SOLN Take 1 kit (354 mLs total) by mouth as directed. 01/22/24   Vinetta Greening, DO    Allergies as of 01/22/2024 - Review Complete 12/07/2023  Allergen Reaction Noted   Keflex  [cephalexin ] Nausea And Vomiting 11/12/2015    Family History  Problem Relation Age of Onset   Diabetes Mother    Hypertension Mother    Stroke Mother    Cancer Mother        blood   Kidney Stones Mother    Cancer Brother        melanoma   COPD Sister    Hypertension Sister    Hypertension Sister    Colon cancer Neg Hx    Colon polyps Neg Hx     Social History   Socioeconomic History   Marital status: Married    Spouse name: Not on file   Number of  children: Not on file   Years of education: Not on file   Highest education level: Not on file  Occupational History   Not on file  Tobacco Use   Smoking status: Former    Current packs/day: 0.00    Types: Cigarettes    Start date: 02/23/1983    Quit date: 02/22/1998    Years since quitting: 25.9   Smokeless tobacco: Never   Tobacco comments:    quit years ago  Vaping Use   Vaping status: Never Used  Substance and Sexual Activity   Alcohol use: Yes    Comment: occ   Drug use: No   Sexual activity: Yes    Birth control/protection: Post-menopausal  Other Topics Concern   Not on file  Social History Narrative   HOBBIES: COACH BASKETBALL, SOCCER, DANCE AND PLAYS, WORKS PUZZLES. CARES FOR CHICKENS, GUINEAS, A GOATS, A PIGS, A RABBITS.    MARRIED SINCE 2015. OCCASIONAL ETOH.      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   Social Drivers of Corporate investment banker Strain: Low Risk  (09/23/2021)   Overall Financial Resource Strain (CARDIA)    Difficulty of Paying Living Expenses: Not hard at all  Food Insecurity: No Food Insecurity (09/23/2021)   Hunger Vital Sign    Worried About Running Out of Food in the Last Year: Never true    Ran Out of Food in the Last Year: Never true  Transportation Needs: No Transportation Needs (09/23/2021)   PRAPARE - Administrator, Civil Service (Medical): No    Lack of Transportation (Non-Medical): No  Physical Activity: Insufficiently Active (09/23/2021)   Exercise Vital Sign    Days of Exercise per Week: 3 days    Minutes of Exercise per Session: 40 min  Stress: No Stress Concern Present (09/23/2021)   [employer] of Occupational Health - Occupational Stress Questionnaire    Feeling of Stress : Not at all  Social Connections: Moderately Integrated (09/23/2021)   Social Connection and Isolation Panel [NHANES]    Frequency of Communication with Friends and Family: Never    Frequency of Social Gatherings with Friends and Family: Three times a week    Attends Religious Services: More than 4 times per year    Active Member of Clubs or Organizations: No    Attends Banker Meetings: Never    Marital Status: Married  Catering manager Violence: Not At Risk (09/23/2021)   Humiliation, Afraid, Rape, and Kick questionnaire    Fear of Current or Ex-Partner: No    Emotionally Abused: No    Physically Abused: No    Sexually Abused: No    Review of Systems: See HPI, otherwise negative ROS  Physical Exam: Vital signs in last 24 hours: Temp:  [98.7 F (37.1 C)] 98.7 F (37.1 C) (05/20 1219) Pulse Rate:  [75] 75 (05/20 1219) Resp:  [12] 12 (05/20 1219) BP: (133)/(67) 133/67 (05/20 1219) SpO2:  [97 %] 97 %  (05/20 1219) Weight:  [77.1 kg] 77.1 kg (05/20 1219)   General:   Alert,  Well-developed, well-nourished, pleasant and cooperative in NAD Head:  Normocephalic and atraumatic. Eyes:  Sclera clear, no icterus.   Conjunctiva pink. Ears:  Normal auditory acuity. Nose:  No deformity, discharge,  or lesions. Msk:  Symmetrical without gross deformities. Normal posture. Extremities:  Without clubbing or edema. Neurologic:  Alert and  oriented x4;  grossly normal neurologically. Skin:  Intact without significant lesions or rashes. Psych:  Alert and cooperative. Normal mood and affect.  Impression/Plan: Robin Dixon is here for a colonoscopy to be performed for colon cancer screening purposes.  The risks of the procedure including infection, bleed, or perforation as well as benefits, limitations, alternatives and imponderables have been reviewed with the patient. Questions have been answered. All parties agreeable.

## 2024-02-06 NOTE — Anesthesia Procedure Notes (Signed)
 Date/Time: 02/06/2024 1:10 PM  Performed by: Sherwin Donate, CRNAPre-anesthesia Checklist: Patient identified, Emergency Drugs available, Suction available and Patient being monitored Patient Re-evaluated:Patient Re-evaluated prior to induction Oxygen Delivery Method: Nasal cannula Induction Type: IV induction Placement Confirmation: positive ETCO2

## 2024-02-06 NOTE — Op Note (Signed)
 [facility] Patient Name: Robin Dixon Procedure Date: 02/06/2024 1:08 PM MRN: [id number] Date of Birth: [birth year] Attending MD: Rolando Cliche. Mordechai April , [place], [id number] CSN: [id number] Age: 58 Admit Type: Outpatient Procedure:                Colonoscopy Indications:              Screening for colorectal malignant neoplasm Providers:                Rolando Cliche. Mordechai April, DO, Alisa App, Vonna Guardian Referring MD:              Medicines:                See the Anesthesia note for documentation of the                            administered medications Complications:            No immediate complications. Estimated Blood Loss:     Estimated blood loss: none. Procedure:                Pre-Anesthesia Assessment:                           - The anesthesia plan was to use monitored                            anesthesia care (MAC).                           After obtaining informed consent, the colonoscope                            was passed under direct vision. Throughout the                            procedure, the patient's blood pressure, pulse, and                            oxygen saturations were monitored continuously. The                            [id number]-HQ190L ([id number]) scope was introduced through                            the anus and advanced to the the cecum, identified                            by appendiceal orifice and ileocecal valve. The                            colonoscopy was performed without difficulty. The                            patient tolerated the procedure well. The quality                            of  the bowel preparation was evaluated using the                            BBPS [facility] Bowel Preparation Scale) with scores                            of: Right Colon = 3, Transverse Colon = 3 and Left                            Colon = 3 (entire mucosa seen well with no residual                            staining, small fragments of stool or opaque                             liquid). The total BBPS score equals 9. Scope In: 1:15:51 PM Scope Out: 1:28:40 PM Scope Withdrawal Time: 0 hours 8 minutes 51 seconds  Total Procedure Duration: 0 hours 12 minutes 49 seconds  Findings:      Non-bleeding internal hemorrhoids were found during endoscopy.      Multiple large-mouthed and small-mouthed diverticula were found in the       sigmoid colon, descending colon and transverse colon.      The exam was otherwise without abnormality. Impression:               - Non-bleeding internal hemorrhoids.                           - Diverticulosis in the sigmoid colon, in the                            descending colon and in the transverse colon.                           - The examination was otherwise normal.                           - No specimens collected. Moderate Sedation:      Per Anesthesia Care Recommendation:           - Patient has a contact number available for                            emergencies. The signs and symptoms of potential                            delayed complications were discussed with the                            patient. Return to normal activities tomorrow.                            Written discharge instructions were provided to the  patient.                           - Resume previous diet.                           - Continue present medications.                           - Repeat colonoscopy in 10 years for screening                            purposes.                           - Return to GI clinic PRN. Procedure Code(s):        --- Professional ---                           B1478, Colorectal cancer screening; colonoscopy on                            individual not meeting criteria for high risk Diagnosis Code(s):        --- Professional ---                           Z12.11, Encounter for screening for malignant                            neoplasm of colon                            K64.8, Other hemorrhoids                           K57.30, Diverticulosis of large intestine without                            perforation or abscess without bleeding CPT copyright 2022 American Medical Association. All rights reserved. The codes documented in this report are preliminary and upon coder review may  be revised to meet current compliance requirements. Rolando Cliche. Mordechai April, DO Rolando Cliche. Mordechai April, DO 02/06/2024 1:39:41 PM This report has been signed electronically. Number of Addenda: 0

## 2024-02-06 NOTE — Anesthesia Postprocedure Evaluation (Signed)
 Anesthesia Post Note  Patient: Robin Dixon  Procedure(s) Performed: COLONOSCOPY  Patient location during evaluation: PACU Anesthesia Type: General Level of consciousness: awake and alert Pain management: pain level controlled Vital Signs Assessment: post-procedure vital signs reviewed and stable Respiratory status: spontaneous breathing, nonlabored ventilation, respiratory function stable and patient connected to nasal cannula oxygen Cardiovascular status: stable and blood pressure returned to baseline Postop Assessment: no apparent nausea or vomiting Anesthetic complications: no  No notable events documented.   Last Vitals:  Vitals:   02/06/24 1219 02/06/24 1332  BP: 133/67 105/61  Pulse: 75 65  Resp: 12 16  Temp: 37.1 C 36.5 C  SpO2: 97% 98%    Last Pain:  Vitals:   02/06/24 1332  TempSrc: Oral  PainSc: 0-No pain                 Beacher Limerick

## 2024-02-06 NOTE — Anesthesia Preprocedure Evaluation (Addendum)
 Anesthesia Evaluation  Patient identified by MRN, date of birth, ID band Patient awake    Reviewed: Allergy & Precautions, H&P , NPO status , Patient's Chart, lab work & pertinent test results  Airway Mallampati: II  TM Distance: >3 FB Neck ROM: Full    Dental no notable dental hx.    Pulmonary former smoker   Pulmonary exam normal breath sounds clear to auscultation       Cardiovascular hypertension, Normal cardiovascular exam Rhythm:Regular Rate:Normal     Neuro/Psych  PSYCHIATRIC DISORDERS Anxiety      Neuromuscular disease    GI/Hepatic Neg liver ROS,GERD  ,,  Endo/Other  negative endocrine ROS    Renal/GU negative Renal ROS  negative genitourinary   Musculoskeletal negative musculoskeletal ROS (+)    Abdominal   Peds negative pediatric ROS (+)  Hematology negative hematology ROS (+)   Anesthesia Other Findings   Reproductive/Obstetrics negative OB ROS                             Anesthesia Physical Anesthesia Plan  ASA: 2  Anesthesia Plan: General   Post-op Pain Management:    Induction: Intravenous  PONV Risk Score and Plan: Propofol  infusion  Airway Management Planned: Nasal Cannula  Additional Equipment:   Intra-op Plan:   Post-operative Plan:   Informed Consent: I have reviewed the patients History and Physical, chart, labs and discussed the procedure including the risks, benefits and alternatives for the proposed anesthesia with the patient or authorized representative who has indicated his/her understanding and acceptance.     Dental advisory given  Plan Discussed with: CRNA  Anesthesia Plan Comments:        Anesthesia Quick Evaluation

## 2024-02-07 ENCOUNTER — Encounter (HOSPITAL_COMMUNITY): Payer: Self-pay | Admitting: Internal Medicine

## 2024-03-20 ENCOUNTER — Other Ambulatory Visit: Payer: Self-pay | Admitting: Internal Medicine

## 2024-04-01 ENCOUNTER — Encounter: Payer: Self-pay | Admitting: Family Medicine

## 2024-04-01 ENCOUNTER — Other Ambulatory Visit: Payer: Self-pay | Admitting: Family Medicine

## 2024-04-01 MED ORDER — DICLOFENAC SODIUM 75 MG PO TBEC: DELAYED_RELEASE_TABLET | ORAL | 3 refills | Status: DC

## 2024-04-29 ENCOUNTER — Other Ambulatory Visit: Payer: Self-pay | Admitting: Family Medicine

## 2024-04-29 DIAGNOSIS — E785 Hyperlipidemia, unspecified: Secondary | ICD-10-CM

## 2024-05-07 ENCOUNTER — Other Ambulatory Visit (HOSPITAL_COMMUNITY): Payer: Self-pay | Admitting: Family Medicine

## 2024-05-07 DIAGNOSIS — Z1231 Encounter for screening mammogram for malignant neoplasm of breast: Secondary | ICD-10-CM

## 2024-05-14 ENCOUNTER — Ambulatory Visit: Payer: 59 | Admitting: Family Medicine

## 2024-05-14 VITALS — BP 126/78 | HR 61 | Temp 97.5°F | Ht 65.0 in | Wt 171.0 lb

## 2024-05-14 DIAGNOSIS — E785 Hyperlipidemia, unspecified: Secondary | ICD-10-CM

## 2024-05-14 DIAGNOSIS — G4700 Insomnia, unspecified: Secondary | ICD-10-CM | POA: Diagnosis not present

## 2024-05-14 DIAGNOSIS — R202 Paresthesia of skin: Secondary | ICD-10-CM | POA: Diagnosis not present

## 2024-05-14 DIAGNOSIS — I10 Essential (primary) hypertension: Secondary | ICD-10-CM

## 2024-05-14 MED ORDER — SERTRALINE HCL 100 MG PO TABS: 200.0000 mg | ORAL_TABLET | Freq: Every day | ORAL | 1 refills | Status: AC

## 2024-05-14 MED ORDER — DICLOFENAC SODIUM 75 MG PO TBEC: DELAYED_RELEASE_TABLET | ORAL | 3 refills | Status: DC

## 2024-05-14 MED ORDER — ALPRAZOLAM 1 MG PO TABS: 1.0000 mg | ORAL_TABLET | Freq: Two times a day (BID) | ORAL | 5 refills | Status: AC

## 2024-05-14 MED ORDER — ROSUVASTATIN CALCIUM 40 MG PO TABS: 40.0000 mg | ORAL_TABLET | Freq: Every day | ORAL | 1 refills | Status: AC

## 2024-05-14 MED ORDER — AMLODIPINE BESYLATE 5 MG PO TABS: 5.0000 mg | ORAL_TABLET | Freq: Every day | ORAL | 1 refills | Status: AC

## 2024-05-14 MED ORDER — TRAZODONE HCL 50 MG PO TABS: 50.0000 mg | ORAL_TABLET | Freq: Every evening | ORAL | 1 refills | Status: AC | PRN

## 2024-05-14 NOTE — Progress Notes (Signed)
 Discussed the use of AI scribe software for clinical note transcription with the patient, who gave verbal consent to proceed.  History of Present Illness   Robin Dixon is a 58 year old female who presents with tingling in her left arm.  She experiences intermittent tingling sensations in her left arm, which sometimes wake her up at night, accompanied by numbness. There is no associated weakness in her hand.  Previous treatments were effective for her neck and hip issues, but the tingling in her arm persists. She is currently taking alprazolam , one tablet in the morning and one later in the day.  No chest pressure, tightness, or pain during physical activities such as walking, yard work, or housework. No issues with bowel movements, no blood in her urine or stool, and no severe acid reflux issues.  Her mood and stress levels are stable, and she feels that her medication is effective.     General-in no acute distress Eyes-no discharge Lungs-respiratory rate normal, CTA CV-no murmurs,RRR Extremities skin warm dry no edema Neuro grossly normal Behavior normal, alert  Past Medical History:  Diagnosis Date   Anxiety 03/29/2013   Carpal tunnel syndrome 07/02/2015   Right   History of neck injury    Hot flashes 03/29/2013   Hyperlipidemia 12/23/2019   Hypertension    Mental disorder    anxiety depression   PMB (postmenopausal bleeding) 06/12/2013   US  WNL    Current Outpatient Medications on File Prior to Visit  Medication Sig Dispense Refill   cholestyramine  (QUESTRAN ) 4 g packet Take 0.5 packets (2 g total) by mouth daily. 30 each 6   hyoscyamine  (LEVSIN  SL) 0.125 MG SL tablet Place 1 tablet (0.125 mg total) under the tongue every 4 (four) hours as needed. 90 tablet 3   pantoprazole  (PROTONIX ) 40 MG tablet TAKE 1 TABLET BY MOUTH TWICE DAILY BEFORE A MEAL 180 tablet 1   No current facility-administered medications on file prior to visit.   Assessment and Plan    Left arm  paresthesia Intermittent tingling and numbness in the left arm, prefers monitoring symptoms. - Monitor symptoms and reassess if she worsens or persists.  Essential hypertension Blood pressure well-controlled with current medication. Discussed potential switch to telmisartan and reducing alprazolam  dosage. - Continue current antihypertensive medication regimen. - Consider telmisartan if valsartan is unavailable, with dosing adjustments. - Gradually reduce alprazolam  dosage as tolerated.  Depression Mood stable with current medication. - Continue current antidepressant medication regimen.     Hyperlipidemia under good control with medication continue medication Hypertension under good control with medicine continue medication healthy diet Xanax  at nighttime to help with anxiety anxiety and insomnia uses Xanax  during the daytime We did discuss the importance of trying to reduce the dose over time I do encourage her to try to cut back to half tablet in the morning and 1 tablet in the evening.  As she gets older it would be wise for her to try to lessen this Reflux under good control Lab work from January reviewed no need to do lab work on this visit but do lab work on the next visit

## 2024-05-15 ENCOUNTER — Other Ambulatory Visit: Payer: Self-pay

## 2024-05-15 DIAGNOSIS — Z79899 Other long term (current) drug therapy: Secondary | ICD-10-CM

## 2024-05-15 DIAGNOSIS — E559 Vitamin D deficiency, unspecified: Secondary | ICD-10-CM

## 2024-05-15 DIAGNOSIS — I10 Essential (primary) hypertension: Secondary | ICD-10-CM

## 2024-05-15 DIAGNOSIS — E785 Hyperlipidemia, unspecified: Secondary | ICD-10-CM

## 2024-05-16 ENCOUNTER — Ambulatory Visit (HOSPITAL_COMMUNITY)
Admission: RE | Admit: 2024-05-16 | Discharge: 2024-05-16 | Disposition: A | Discharge status: Home / Self Care | Source: Ambulatory Visit | Attending: Family Medicine | Admitting: Family Medicine

## 2024-05-16 ENCOUNTER — Encounter (HOSPITAL_COMMUNITY): Payer: Self-pay

## 2024-05-16 DIAGNOSIS — Z1231 Encounter for screening mammogram for malignant neoplasm of breast: Secondary | ICD-10-CM | POA: Insufficient documentation

## 2024-05-17 ENCOUNTER — Encounter: Payer: Self-pay | Admitting: Internal Medicine

## 2024-05-22 ENCOUNTER — Other Ambulatory Visit: Payer: Self-pay

## 2024-05-22 DIAGNOSIS — Z79899 Other long term (current) drug therapy: Secondary | ICD-10-CM

## 2024-05-22 DIAGNOSIS — E785 Hyperlipidemia, unspecified: Secondary | ICD-10-CM

## 2024-05-22 DIAGNOSIS — I10 Essential (primary) hypertension: Secondary | ICD-10-CM

## 2024-06-28 ENCOUNTER — Ambulatory Visit: Admitting: Internal Medicine

## 2024-06-28 ENCOUNTER — Encounter: Payer: Self-pay | Admitting: Internal Medicine

## 2024-06-28 VITALS — BP 135/67 | HR 62 | Temp 98.5°F | Ht 65.5 in | Wt 168.0 lb

## 2024-06-28 DIAGNOSIS — K219 Gastro-esophageal reflux disease without esophagitis: Secondary | ICD-10-CM

## 2024-06-28 DIAGNOSIS — R197 Diarrhea, unspecified: Secondary | ICD-10-CM | POA: Diagnosis not present

## 2024-06-28 DIAGNOSIS — K589 Irritable bowel syndrome without diarrhea: Secondary | ICD-10-CM

## 2024-06-28 NOTE — Progress Notes (Signed)
 Gastroenterology Progress Note    Primary Care Physician:  Alphonsa Glendia LABOR, MD Primary Gastroenterologist:  Dr. Shaaron   Pre-Procedure History & Physical: HPI:  Robin Dixon is a 58 y.o. female here for follow-up of GERD and intermittent diarrhea.  GERD well-controlled most the time with pantoprazole  40 mg twice daily before meals.  Has breakthrough symptoms at night with regurgitation after eating a spicy meal or eating late.  No dysphagia.  No Barrett's on prior EGD  Intermittent postprandial abdominal cramps and diarrhea punctuated by periods of normal bowel function occasionally goes a day wo a BM.  Up-to-date on colonoscopy earlier this year-diverticulosis.  Takes Imodium with good effect on occasion.  Afraid to go out to eat due to that scenario often precipitating symptoms. Good results with cholestyramine  one half a packet daily.  Past Medical History:  Diagnosis Date   Anxiety 03/29/2013   Carpal tunnel syndrome 07/02/2015   Right   History of neck injury    Hot flashes 03/29/2013   Hyperlipidemia 12/23/2019   Hypertension    Mental disorder    anxiety depression   PMB (postmenopausal bleeding) 06/12/2013   US  WNL    Past Surgical History:  Procedure Laterality Date   CARPAL TUNNEL RELEASE Right 09/23/2015   Procedure: RIGHT CARPAL TUNNEL RELEASE;  Surgeon: Taft FORBES Minerva, MD;  Location: [facility];  Service: Orthopedics;  Laterality: Right;   CHOLECYSTECTOMY     [place]   COLONOSCOPY N/A 12/27/2013   Procedure: COLONOSCOPY;  Surgeon: Margo LITTIE Haddock, MD;  Location: [facility];  Service: Endoscopy;  Laterality: N/A;  1:45   COLONOSCOPY N/A 02/06/2024   Procedure: COLONOSCOPY;  Surgeon: Cindie Carlin POUR, DO;  Location: [facility];  Service: Endoscopy;  Laterality: N/A;  130pm, asa 2   ESOPHAGOGASTRODUODENOSCOPY N/A 12/27/2013   Procedure: ESOPHAGOGASTRODUODENOSCOPY (EGD);  Surgeon: Margo LITTIE Haddock, MD;  Location: [facility];  Service: Endoscopy;   Laterality: N/A;    Prior to Admission medications   Medication Sig Start Date End Date Taking? Authorizing Provider  ALPRAZolam  (XANAX ) 1 MG tablet Take 1 tablet (1 mg total) by mouth 2 (two) times daily. 05/14/24  Yes Alphonsa Glendia LABOR, MD  amLODipine  (NORVASC ) 5 MG tablet Take 1 tablet (5 mg total) by mouth daily. 05/14/24  Yes Alphonsa Glendia LABOR, MD  cholestyramine  (QUESTRAN ) 4 g packet Take 0.5 packets (2 g total) by mouth daily. 05/23/23  Yes Ezzard Sonny RAMAN, PA-C  diclofenac  (VOLTAREN ) 75 MG EC tablet Twice daily as needed for arthritic pain 05/14/24  Yes Luking, Glendia LABOR, MD  hyoscyamine  (LEVSIN  SL) 0.125 MG SL tablet Place 1 tablet (0.125 mg total) under the tongue every 4 (four) hours as needed. 05/23/23  Yes Ezzard Sonny RAMAN, PA-C  pantoprazole  (PROTONIX ) 40 MG tablet TAKE 1 TABLET BY MOUTH TWICE DAILY BEFORE A MEAL 03/20/24  Yes Ezzard Sonny RAMAN, PA-C  rosuvastatin  (CRESTOR ) 40 MG tablet Take 1 tablet (40 mg total) by mouth daily. 05/14/24  Yes Alphonsa Glendia LABOR, MD  sertraline  (ZOLOFT ) 100 MG tablet Take 2 tablets (200 mg total) by mouth daily. 05/14/24  Yes Alphonsa Glendia LABOR, MD  traZODone  (DESYREL ) 50 MG tablet Take 1 tablet (50 mg total) by mouth at bedtime as needed. for sleep 05/14/24  Yes Alphonsa Glendia LABOR, MD    Allergies as of 06/28/2024 - Review Complete 06/28/2024  Allergen Reaction Noted   Keflex  [cephalexin ] Nausea And Vomiting 11/12/2015    Family History  Problem Relation Age  of Onset   Diabetes Mother    Hypertension Mother    Stroke Mother    Cancer Mother        blood   Kidney Stones Mother    Cancer Brother        melanoma   COPD Sister    Hypertension Sister    Hypertension Sister    Colon cancer Neg Hx    Colon polyps Neg Hx     Social History   Socioeconomic History   Marital status: Married    Spouse name: Not on file   Number of children: Not on file   Years of education: Not on file   Highest education level: Not on file  Occupational History   Not on file   Tobacco Use   Smoking status: Former    Current packs/day: 0.00    Types: Cigarettes    Start date: 02/23/1983    Quit date: 02/22/1998    Years since quitting: 26.3   Smokeless tobacco: Never   Tobacco comments:    quit years ago  Vaping Use   Vaping status: Never Used  Substance and Sexual Activity   Alcohol use: Yes    Comment: occ   Drug use: No   Sexual activity: Yes    Birth control/protection: Post-menopausal  Other Topics Concern   Not on file  Social History Narrative   HOBBIES: COACH BASKETBALL, SOCCER, DANCE AND PLAYS, WORKS PUZZLES. CARES FOR CHICKENS, GUINEAS, A GOATS, A PIGS, A RABBITS.   MARRIED SINCE 2015. OCCASIONAL ETOH.      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   Social Drivers of Corporate investment banker Strain: Low Risk  (09/23/2021)   Overall Financial Resource Strain (CARDIA)    Difficulty of Paying Living Expenses: Not hard at all  Food Insecurity: No Food Insecurity (09/23/2021)   Hunger Vital Sign    Worried About Running Out of Food in the Last Year: Never true    Ran Out of Food in the Last Year: Never true  Transportation Needs: No Transportation Needs (09/23/2021)   PRAPARE - Administrator, Civil Service (Medical): No    Lack of Transportation (Non-Medical): No  Physical Activity: Insufficiently Active (09/23/2021)   Exercise Vital Sign    Days of Exercise per Week: 3 days    Minutes of Exercise per Session: 40 min  Stress: No Stress Concern Present (09/23/2021)   [employer] of Occupational Health - Occupational Stress Questionnaire    Feeling of Stress : Not at all  Social Connections: Moderately Integrated (09/23/2021)   Social Connection and Isolation Panel    Frequency of Communication with Friends and Family: Never    Frequency of Social Gatherings with Friends and Family: Three times a week    Attends Religious Services: More than 4 times per year    Active  Member of Clubs or Organizations: No    Attends Banker Meetings: Never    Marital Status: Married  Catering manager Violence: Not At Risk (09/23/2021)   Humiliation, Afraid, Rape, and Kick questionnaire    Fear of Current or Ex-Partner: No    Emotionally Abused: No    Physically Abused: No    Sexually Abused: No    Review of Systems   See HPI, otherwise negative ROS  Physical Exam: BP 135/67 (BP Location: Right Arm, Patient Position: Sitting, Cuff Size: Normal)   Pulse 62  Temp 98.5 F (36.9 C) (Oral)   Ht 5' 5.5 (1.664 m)   Wt 168 lb (76.2 kg)   SpO2 99%   BMI 27.53 kg/m  General:   Alert,  Well-developed, well-nourished, pleasant and cooperative in NAD Neck:  Supple; no masses or thyromegaly. No significant cervical adenopathy. Lungs:  Clear throughout to auscultation.   No wheezes, crackles, or rhonchi. No acute distress. Heart:  Regular rate and rhythm; no murmurs, clicks, rubs,  or gallops. Abdomen: Non-distended, normal bowel sounds.  Soft and nontender without appreciable mass or hepatosplenomegaly.    Impression/Plan:   58 year old lady with suboptimally controlled GERD particularly in the evenings.  Without alarm features otherwise.  May benefit from adding Pepcid Complete to bedtime regimen.  Diarrhea intermittent with days where bowel function is normal.  In fact, sometimes goes a day without bowel movement.  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  This is a supplement which is taken after a meal to keep the acid from coming up in your esophagus  Sleeping on your left side is better than on your back or right side for keeping acid out of your esophagus  For diarrhea,  would consider taking Imodium daily as needed and before events like going out to eat to lessen cramps and diarrhea  Office visit here in 4 months   Notice: This dictation was prepared with Dragon dictation along with smaller phrase technology. Any transcriptional errors that result from this process are unintentional and may not be corrected upon review.

## 2024-06-28 NOTE — Patient Instructions (Signed)
 It was good to see you again today  GERD information provided  For now, continue pantoprazole  40 mg twice daily 30 minutes before breakfast and supper  May add Pepcid Complete 1 to 2 tablets chewed and swallowed in the evening as needed  May also try reflux Gourmet (amazon.com).  This is a supplement which is taken after a meal to keep the acid from coming up in your esophagus  Sleeping on your left side is better than on your back or right side for keeping acid out of your esophagus  For diarrhea, would consider taking Imodium daily as needed and before events like going out to eat to lessen cramps and diarrhea  Office visit here in 4 months

## 2024-07-23 ENCOUNTER — Encounter: Payer: Self-pay | Admitting: Emergency Medicine

## 2024-07-23 ENCOUNTER — Ambulatory Visit
Admission: EM | Admit: 2024-07-23 | Discharge: 2024-07-23 | Disposition: A | Discharge status: Home / Self Care | Attending: Nurse Practitioner | Admitting: Nurse Practitioner

## 2024-07-23 DIAGNOSIS — J069 Acute upper respiratory infection, unspecified: Secondary | ICD-10-CM

## 2024-07-23 DIAGNOSIS — R059 Cough, unspecified: Secondary | ICD-10-CM | POA: Diagnosis not present

## 2024-07-23 LAB — POC SOFIA SARS ANTIGEN FIA: SARS Coronavirus 2 Ag: NEGATIVE

## 2024-07-23 MED ORDER — AZITHROMYCIN 250 MG PO TABS: 250.0000 mg | ORAL_TABLET | Freq: Every day | ORAL | 0 refills | Status: AC

## 2024-07-23 NOTE — Discharge Instructions (Signed)
 The COVID test was negative.   Take medication as prescribed.  Continue the nasal spray that you currently have at home along with Mucinex for your cough. Increase fluids and allow for plenty of rest. You may take over-the-counter Tylenol  as needed for pain, fever, or general discomfort. Recommend use of normal saline nasal spray throughout the day for nasal congestion and runny nose. For the cough, recommend use of a humidifier in your bedroom at nighttime during sleep and sleeping elevated on pillows while symptoms persist. If your symptoms fail to improve with this treatment, you may follow-up in this clinic or with your primary care physician for further evaluation. Follow-up as needed.

## 2024-07-23 NOTE — ED Provider Notes (Signed)
 [facility]    CSN: [id number] Arrival date & time: 07/23/24  0816      History   Chief Complaint No chief complaint on file.   HPI Robin Dixon is a 58 y.o. female.   The history is provided by the patient.   Patient presents with a 5 to 6-day history of headache, nasal congestion, runny nose, right ear pain, sore throat and cough.  Patient states that she has been experiencing episodes of feeling hot, and then cold.  Denies ear drainage, wheezing, difficulty breathing, chest pain, abdominal pain, nausea, vomiting, diarrhea, or rash.  Patient states that she has been taking her current allergy medication, using an over-the-counter nasal spray, and Mucinex with little relief of her symptoms. Past Medical History:  Diagnosis Date   Anxiety 03/29/2013   Carpal tunnel syndrome 07/02/2015   Right   History of neck injury    Hot flashes 03/29/2013   Hyperlipidemia 12/23/2019   Hypertension    Mental disorder    anxiety depression   PMB (postmenopausal bleeding) 06/12/2013   US  WNL    Patient Active Problem List   Diagnosis Date Noted   GERD (gastroesophageal reflux disease) 05/23/2023   Diarrhea 05/23/2023   Encounter for screening fecal occult blood testing 09/23/2021   Encounter for gynecological examination with Papanicolaou smear of cervix 09/23/2021   Hyperlipidemia 12/23/2019   Nausea without vomiting 01/10/2019   Right upper quadrant abdominal pain 08/29/2018   GAD (generalized anxiety disorder) 05/07/2018   Gastroesophageal reflux disease 06/19/2017   Vitamin D  deficiency 03/25/2017   Chronic constipation 09/03/2016   Insomnia 09/02/2016   Carpal tunnel syndrome of right wrist    Carpal tunnel syndrome 07/02/2015   Dyspepsia 12/26/2013   Colon cancer screening 12/26/2013   Gastritis and gastroduodenitis 11/19/2013   PMB (postmenopausal bleeding) 06/12/2013   Postmenopausal HRT (hormone replacement therapy) 05/03/2013   Hypertension 03/29/2013    Anxiety 03/29/2013   Hot flashes 03/29/2013    Past Surgical History:  Procedure Laterality Date   CARPAL TUNNEL RELEASE Right 09/23/2015   Procedure: RIGHT CARPAL TUNNEL RELEASE;  Surgeon: Taft FORBES Minerva, MD;  Location: [facility];  Service: Orthopedics;  Laterality: Right;   CHOLECYSTECTOMY     [place]   COLONOSCOPY N/A 12/27/2013   Procedure: COLONOSCOPY;  Surgeon: Margo LITTIE Haddock, MD;  Location: [facility];  Service: Endoscopy;  Laterality: N/A;  1:45   COLONOSCOPY N/A 02/06/2024   Procedure: COLONOSCOPY;  Surgeon: Cindie Carlin POUR, DO;  Location: [facility];  Service: Endoscopy;  Laterality: N/A;  130pm, asa 2   ESOPHAGOGASTRODUODENOSCOPY N/A 12/27/2013   Procedure: ESOPHAGOGASTRODUODENOSCOPY (EGD);  Surgeon: Margo LITTIE Haddock, MD;  Location: [facility];  Service: Endoscopy;  Laterality: N/A;    OB History     Gravida  0   Para  0   Term  0   Preterm  0   AB  0   Living  0      SAB  0   IAB  0   Ectopic  0   Multiple  0   Live Births               Home Medications    Prior to Admission medications   Medication Sig Start Date End Date Taking? Authorizing Provider  azithromycin  (ZITHROMAX ) 250 MG tablet Take 1 tablet (250 mg total) by mouth daily. Take first 2 tablets together, then 1 every day until finished. 07/23/24  Yes Leath-Warren, Etta PARAS,  NP  ALPRAZolam  (XANAX ) 1 MG tablet Take 1 tablet (1 mg total) by mouth 2 (two) times daily. 05/14/24   Alphonsa Glendia LABOR, MD  amLODipine  (NORVASC ) 5 MG tablet Take 1 tablet (5 mg total) by mouth daily. 05/14/24   Alphonsa Glendia LABOR, MD  cholestyramine  (QUESTRAN ) 4 g packet Take 0.5 packets (2 g total) by mouth daily. 05/23/23   Ezzard Sonny RAMAN, PA-C  diclofenac  (VOLTAREN ) 75 MG EC tablet Twice daily as needed for arthritic pain 05/14/24   Alphonsa Glendia LABOR, MD  hyoscyamine  (LEVSIN  SL) 0.125 MG SL tablet Place 1 tablet (0.125 mg total) under the tongue every 4 (four) hours as needed. 05/23/23   Ezzard Sonny RAMAN,  PA-C  pantoprazole  (PROTONIX ) 40 MG tablet TAKE 1 TABLET BY MOUTH TWICE DAILY BEFORE A MEAL 03/20/24   Ezzard Sonny RAMAN, PA-C  rosuvastatin  (CRESTOR ) 40 MG tablet Take 1 tablet (40 mg total) by mouth daily. 05/14/24   Alphonsa Glendia LABOR, MD  sertraline  (ZOLOFT ) 100 MG tablet Take 2 tablets (200 mg total) by mouth daily. 05/14/24   Alphonsa Glendia LABOR, MD  traZODone  (DESYREL ) 50 MG tablet Take 1 tablet (50 mg total) by mouth at bedtime as needed. for sleep 05/14/24   Alphonsa Glendia LABOR, MD    Family History Family History  Problem Relation Age of Onset   Diabetes Mother    Hypertension Mother    Stroke Mother    Cancer Mother        blood   Kidney Stones Mother    Cancer Brother        melanoma   COPD Sister    Hypertension Sister    Hypertension Sister    Colon cancer Neg Hx    Colon polyps Neg Hx     Social History Social History   Tobacco Use   Smoking status: Former    Current packs/day: 0.00    Types: Cigarettes    Start date: 02/23/1983    Quit date: 02/22/1998    Years since quitting: 26.4   Smokeless tobacco: Never   Tobacco comments:    quit years ago  Vaping Use   Vaping status: Never Used  Substance Use Topics   Alcohol use: Yes    Comment: occ   Drug use: No     Allergies   Keflex  [cephalexin ]   Review of Systems Review of Systems Per HPI  Physical Exam Triage Vital Signs ED Triage Vitals  Encounter Vitals Group     BP 07/23/24 0831 (!) 143/80     Girls Systolic BP Percentile --      Girls Diastolic BP Percentile --      Boys Systolic BP Percentile --      Boys Diastolic BP Percentile --      Pulse Rate 07/23/24 0831 74     Resp 07/23/24 0831 18     Temp 07/23/24 0831 98.7 F (37.1 C)     Temp Source 07/23/24 0831 Oral     SpO2 07/23/24 0831 95 %     Weight --      Height --      Head Circumference --      Peak Flow --      Pain Score 07/23/24 0832 1     Pain Loc --      Pain Education --      Exclude from Growth Chart --    No data  found.  Updated Vital Signs BP (!) 143/80 (BP Location: Right  Arm)   Pulse 74   Temp 98.7 F (37.1 C) (Oral)   Resp 18   SpO2 95%   Visual Acuity Right Eye Distance:   Left Eye Distance:   Bilateral Distance:    Right Eye Near:   Left Eye Near:    Bilateral Near:     Physical Exam Vitals and nursing note reviewed.  Constitutional:      General: She is not in acute distress.    Appearance: Normal appearance. She is well-developed.  HENT:     Head: Normocephalic and atraumatic.     Right Ear: Ear canal and external ear normal. A middle ear effusion is present.     Left Ear: Tympanic membrane, ear canal and external ear normal.     Nose: Congestion present.     Right Turbinates: Enlarged and swollen.     Left Turbinates: Enlarged and swollen.     Right Sinus: No maxillary sinus tenderness or frontal sinus tenderness.     Left Sinus: No maxillary sinus tenderness or frontal sinus tenderness.     Mouth/Throat:     Lips: Pink.     Mouth: Mucous membranes are moist.     Pharynx: Uvula midline. Posterior oropharyngeal erythema and postnasal drip present. No pharyngeal swelling, oropharyngeal exudate or uvula swelling.     Comments: Cobblestoning present to posterior oropharynx  Eyes:     Extraocular Movements: Extraocular movements intact.     Conjunctiva/sclera: Conjunctivae normal.     Pupils: Pupils are equal, round, and reactive to light.  Neck:     Thyroid : No thyromegaly.     Trachea: No tracheal deviation.  Cardiovascular:     Rate and Rhythm: Normal rate and regular rhythm.     Pulses: Normal pulses.     Heart sounds: Normal heart sounds.  Pulmonary:     Effort: Pulmonary effort is normal. No respiratory distress.     Breath sounds: Normal breath sounds. No stridor. No wheezing, rhonchi or rales.  Abdominal:     General: Bowel sounds are normal.     Palpations: Abdomen is soft.     Tenderness: There is no abdominal tenderness.  Musculoskeletal:     Cervical  back: Normal range of motion and neck supple.  Skin:    General: Skin is warm and dry.  Neurological:     General: No focal deficit present.     Mental Status: She is alert and oriented to person, place, and time.  Psychiatric:        Mood and Affect: Mood normal.        Behavior: Behavior normal.        Thought Content: Thought content normal.        Judgment: Judgment normal.      UC Treatments / Results  Labs (all labs ordered are listed, but only abnormal results are displayed) Labs Reviewed  POC SOFIA SARS ANTIGEN FIA - Normal    EKG   Radiology No results found.  Procedures Procedures (including critical care time)  Medications Ordered in UC Medications - No data to display  Initial Impression / Assessment and Plan / UC Course  I have reviewed the triage vital signs and the nursing notes.  Pertinent labs & imaging results that were available during my care of the patient were reviewed by me and considered in my medical decision making (see chart for details).  The COVID test was negative.  On exam, the patient's lung sounds are clear throughout, room  air sats are at 95%.  Patient is well-appearing, she is in no acute distress, vital signs are stable.  Symptoms have been persistent for the past several days, with minimal relief with use of over-the-counter medications.  Concern for bacterial etiology.  Will treat empirically with azithromycin  250 mg.  Patient will continue use of over-the-counter nasal spray that she has at home and Mucinex for the cough.  Supportive care recommendations were provided and discussed with the patient to include fluids, rest, over-the-counter analgesics, use of normal saline nasal spray, and use of a humidifier during sleep.  Discussed indications with patient regarding follow-up.  Patient was in agreement with this plan of care and verbalizes understanding.  All questions were answered.  Patient stable for discharge.   Final Clinical  Impressions(s) / UC Diagnoses   Final diagnoses:  Acute upper respiratory infection  Cough, unspecified type     Discharge Instructions      The COVID test was negative.   Take medication as prescribed.  Continue the nasal spray that you currently have at home along with Mucinex for your cough. Increase fluids and allow for plenty of rest. You may take over-the-counter Tylenol  as needed for pain, fever, or general discomfort. Recommend use of normal saline nasal spray throughout the day for nasal congestion and runny nose. For the cough, recommend use of a humidifier in your bedroom at nighttime during sleep and sleeping elevated on pillows while symptoms persist. If your symptoms fail to improve with this treatment, you may follow-up in this clinic or with your primary care physician for further evaluation. Follow-up as needed.     ED Prescriptions     Medication Sig Dispense Auth. Provider   azithromycin  (ZITHROMAX ) 250 MG tablet Take 1 tablet (250 mg total) by mouth daily. Take first 2 tablets together, then 1 every day until finished. 6 tablet Leath-Warren, Etta PARAS, NP      PDMP not reviewed this encounter.   Gilmer Etta PARAS, NP 07/23/24 [phone]

## 2024-07-23 NOTE — ED Triage Notes (Signed)
 Sore throat, congestion, cough, headache since Thursday.  Has been taking mucinex with little relief.

## 2024-07-26 ENCOUNTER — Encounter: Payer: Self-pay | Admitting: Family Medicine

## 2024-07-26 ENCOUNTER — Other Ambulatory Visit: Payer: Self-pay | Admitting: Nurse Practitioner

## 2024-07-26 MED ORDER — DICLOFENAC SODIUM 75 MG PO TBEC: DELAYED_RELEASE_TABLET | ORAL | 2 refills | Status: AC

## 2024-09-21 ENCOUNTER — Other Ambulatory Visit: Payer: Self-pay | Admitting: Gastroenterology

## 2024-09-24 ENCOUNTER — Other Ambulatory Visit: Payer: Self-pay | Admitting: Gastroenterology

## 2024-11-13 ENCOUNTER — Ambulatory Visit: Admitting: Family Medicine
